# Patient Record
Sex: Female | Born: 1955 | Race: White | Hispanic: No | Marital: Married | Smoking: Never smoker
Health system: Southern US, Community
[De-identification: ages and names within clinical notes are randomized; demographics above are authoritative.]

## PROBLEM LIST (undated history)

## (undated) DIAGNOSIS — K759 Inflammatory liver disease, unspecified: Secondary | ICD-10-CM

## (undated) DIAGNOSIS — C801 Malignant (primary) neoplasm, unspecified: Secondary | ICD-10-CM

## (undated) HISTORY — PX: OTHER SURGICAL HISTORY: SHX169

---

## 2019-12-31 ENCOUNTER — Ambulatory Visit: Payer: Self-pay | Attending: Internal Medicine

## 2019-12-31 DIAGNOSIS — Z20822 Contact with and (suspected) exposure to covid-19: Secondary | ICD-10-CM

## 2019-12-31 DIAGNOSIS — U071 COVID-19: Secondary | ICD-10-CM | POA: Insufficient documentation

## 2020-01-01 LAB — NOVEL CORONAVIRUS, NAA: SARS-CoV-2, NAA: DETECTED — AB

## 2021-07-25 ENCOUNTER — Other Ambulatory Visit: Payer: Self-pay | Admitting: Urology

## 2021-07-25 DIAGNOSIS — D49511 Neoplasm of unspecified behavior of right kidney: Secondary | ICD-10-CM

## 2021-08-30 ENCOUNTER — Ambulatory Visit
Admission: RE | Admit: 2021-08-30 | Discharge: 2021-08-30 | Disposition: A | Discharge status: Home / Self Care | Payer: Self-pay | Source: Ambulatory Visit | Attending: Urology | Admitting: Urology

## 2021-08-30 DIAGNOSIS — D49511 Neoplasm of unspecified behavior of right kidney: Secondary | ICD-10-CM

## 2021-08-30 IMAGING — MR MR ABDOMEN WO/W CM
17 of 18 series · 45 of 47 positions shown · IV contrast (multihance)
Comparison: [DATE]

CLINICAL DATA: Follow-up right renal mass.

EXAM:
MRI ABDOMEN WITHOUT AND WITH CONTRAST
TECHNIQUE: Multiplanar multisequence MR imaging of the abdomen was performed
both before and after the administration of intravenous contrast.
CONTRAST:  14mL MULTIHANCE GADOBENATE DIMEGLUMINE 529 MG/ML IV SOLN

[Series 3: T2 · coronal · 5.0mm · 0.78mm/px · 2 of 22 slices shown (1 of 3)]
[im 1/22]
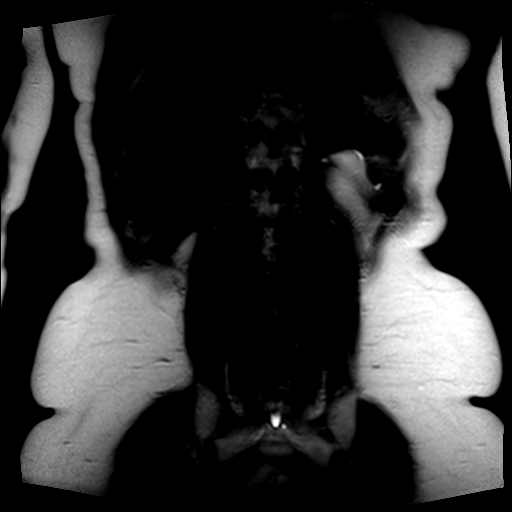
[im 22/22]
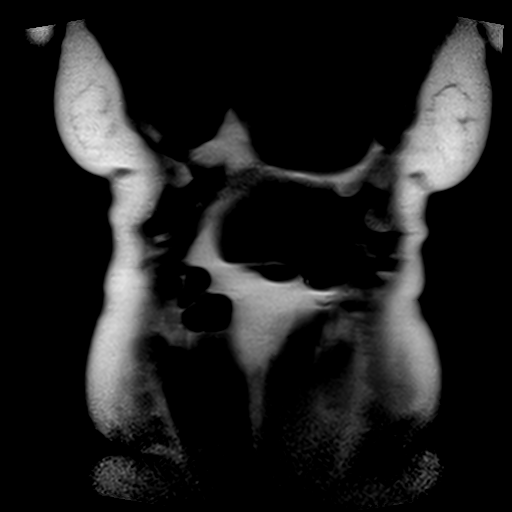

[Series 4: T2 · axial · 5.0mm · 0.66mm/px · z∈[-127,+23]mm · 2 of 26 slices shown (2 of 3)]
[im 1/26]
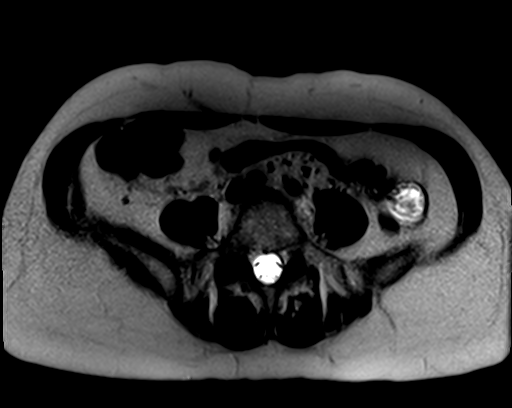
[im 26/26]
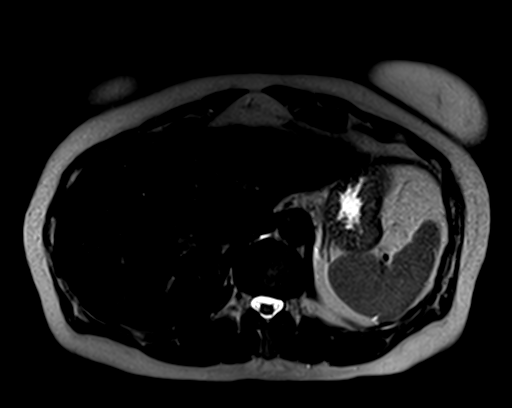

[Series 5: bSSFP · axial · 4.0mm · 0.74mm/px · z∈[-103,+45]mm · 2 of 38 slices shown]
[im 1/38]
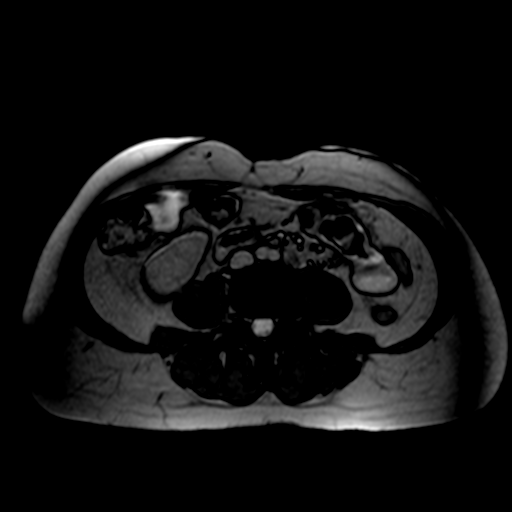
[im 38/38]
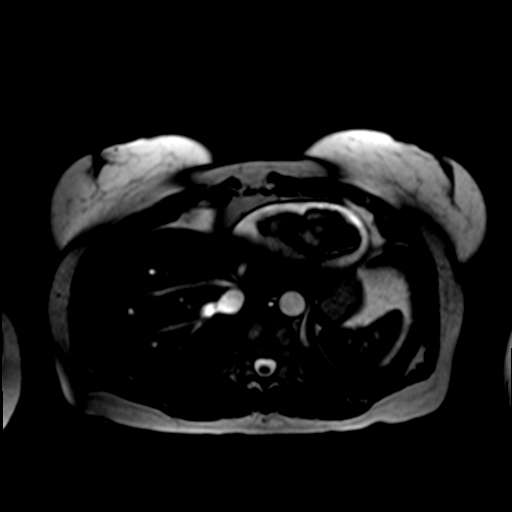

[Series 6: T1 · axial · 5.0mm · 0.66mm/px · z∈[-104,+34]mm · 3 of 48 slices shown]
[im 1/48]
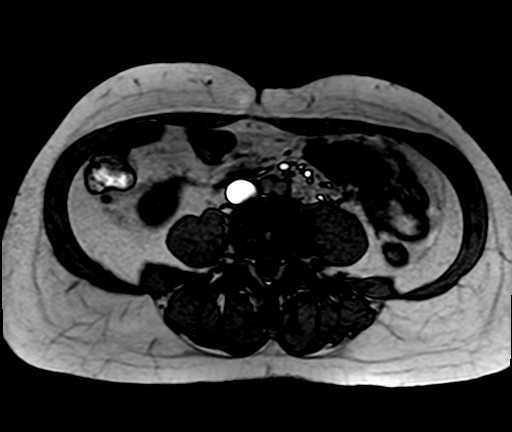
[im 24/48]
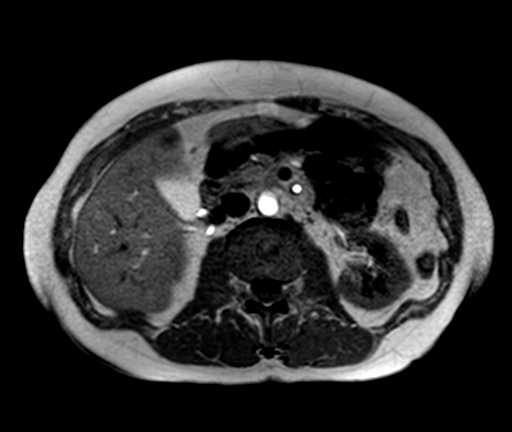
[im 48/48]
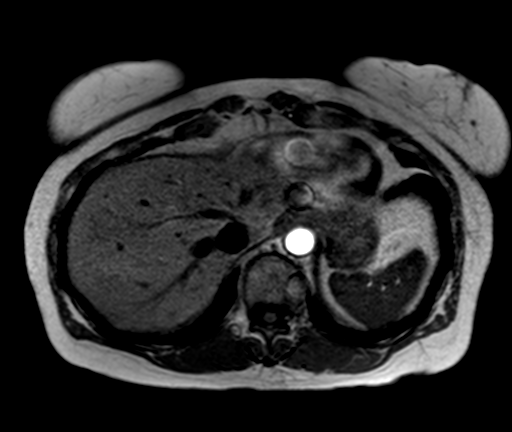

[Series 7: ep2d_diff_b50_500_800_p2_trig · axial · 6.0mm · 1.98mm/px · z∈[-128,+28]mm · 4 of 63 slices shown]
[im 1/63]
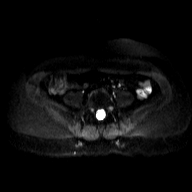
[im 21/63]
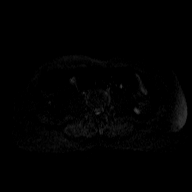
[im 42/63]
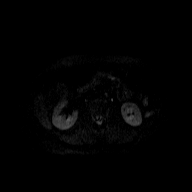
[im 63/63]
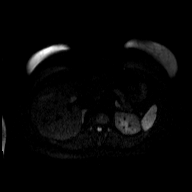

[Series 8: ep2d_[id number]_500_800_p2_trig_adc · axial · 6.0mm · 1.98mm/px · 1 of 21 slices shown]
[im 1/21]
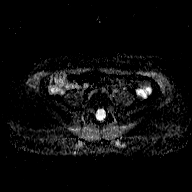

[Series 9: T2 · axial · 5.0mm · 1.33mm/px · z∈[-126,+30]mm · 2 of 27 slices shown (3 of 3)]
[im 1/27]
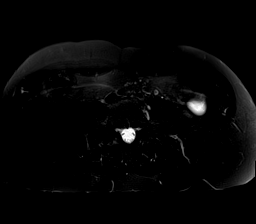
[im 27/27]
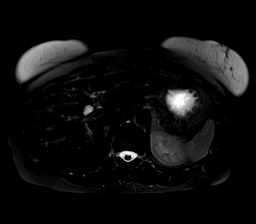

[Series 10: T1 dynamic · axial · non-contrast · 2.3mm · 1.41mm/px · z∈[-96,+40]mm · 3 of 60 slices shown]
[im 1/60]
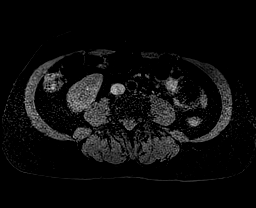
[im 30/60]
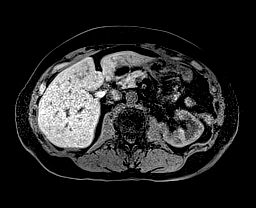
[im 60/60]
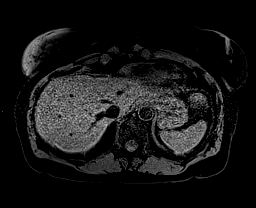

[Series 11: post 25 sec · axial · 2.3mm · 1.41mm/px · z∈[-96,+40]mm · 3 of 60 slices shown]
[im 1/60]
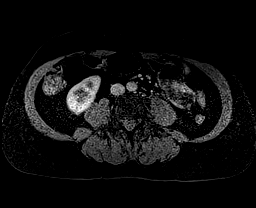
[im 30/60]
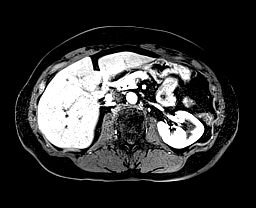
[im 60/60]
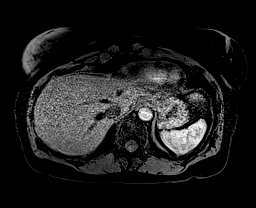

[Series 12: post 25 sec_sub · axial · 2.3mm · 1.41mm/px · z∈[-96,+40]mm · 3 of 60 slices shown]
[im 1/60]
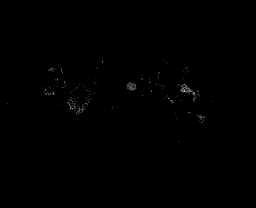
[im 30/60]
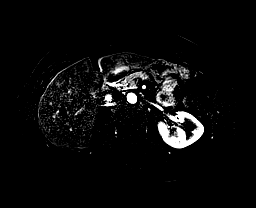
[im 60/60]
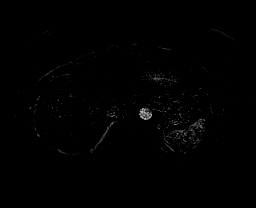

[Series 13: post 45 sec · axial · 2.3mm · 1.41mm/px · z∈[-96,+40]mm · 3 of 60 slices shown]
[im 1/60]
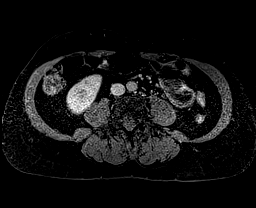
[im 30/60]
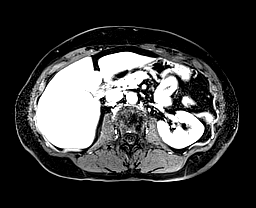
[im 60/60]
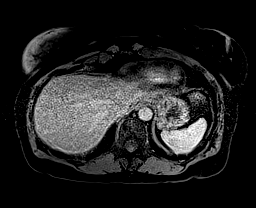

[Series 14: post 45 sec_sub · axial · 2.3mm · 1.41mm/px · z∈[-96,+40]mm · 3 of 60 slices shown]
[im 1/60]
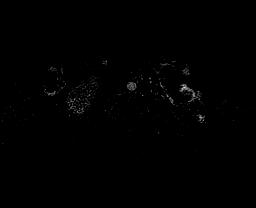
[im 30/60]
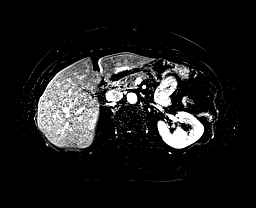
[im 60/60]
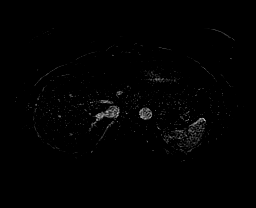

[Series 15: post 90 sec · axial · 2.3mm · 1.41mm/px · z∈[-96,+40]mm · 3 of 60 slices shown]
[im 1/60]
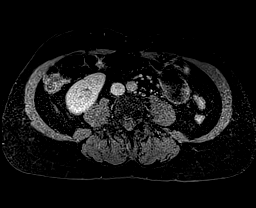
[im 30/60]
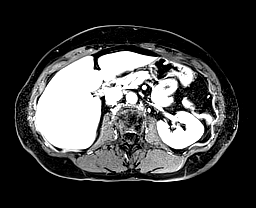
[im 60/60]
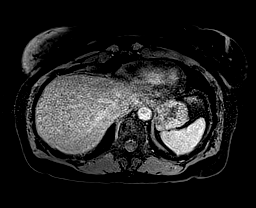

[Series 16: post 90 sec_sub · axial · 2.3mm · 1.41mm/px · z∈[-96,+40]mm · 3 of 60 slices shown]
[im 1/60]
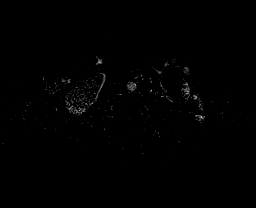
[im 30/60]
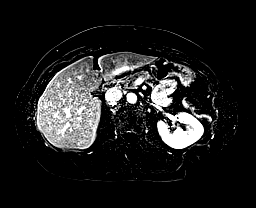
[im 60/60]
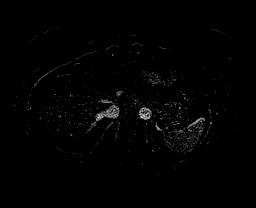

[Series 17: T1 dynamic post-contrast · coronal · 2.6mm · 0.78mm/px · 3 of 52 slices shown]
[im 1/52]
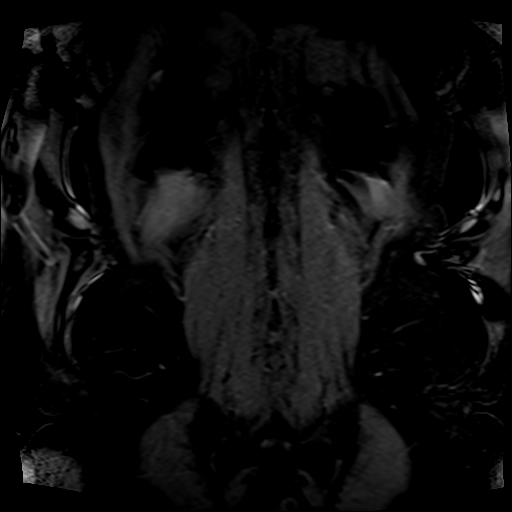
[im 26/52]
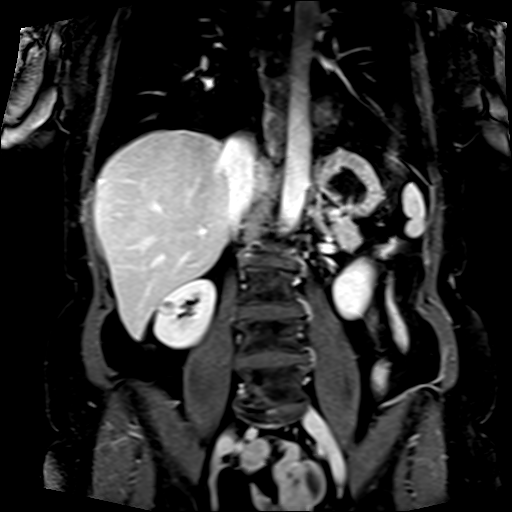
[im 52/52]
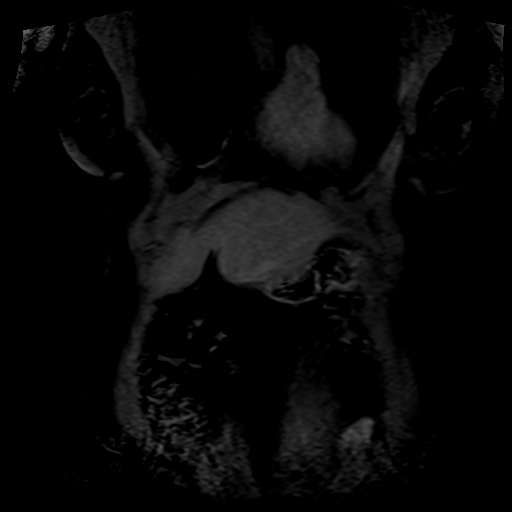

[Series 18: post axial 3+ · axial · 2.3mm · 1.41mm/px · z∈[-96,+40]mm · 3 of 60 slices shown (1 of 2)]
[im 1/60]
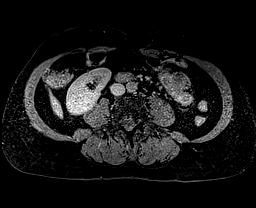
[im 30/60]
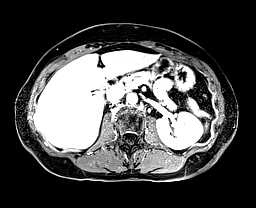
[im 60/60]
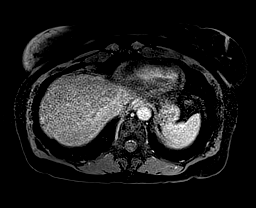

[Series 19: post axial 3+ · axial · 2.3mm · 1.41mm/px · z∈[-96,-29]mm · 2 of 60 slices shown (2 of 2)]
[im 1/60]
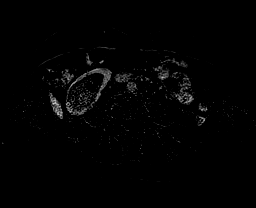
[im 30/60]
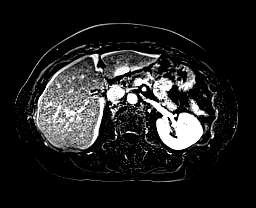

[45 of 47 positions shown; findings below may reference images not displayed]

FINDINGS: Lower chest: No acute findings.

Hepatobiliary: No hepatic masses identified. Stable 13 mm cyst seen
in the central liver adjacent to the porta hepatis and common duct,
which could represent a benign hepatic cyst or possibly a small
choledochal cyst. No evidence of biliary ductal dilatation.
Gallbladder is unremarkable.

Pancreas:  No mass or inflammatory changes.

Spleen:  Within normal limits in size and appearance.

Adrenals/Urinary Tract: Normal appearance of adrenal glands and left
kidney. A small enhancing mass is again seen in the lateral
interpolar region the right kidney which measures 13 x 12 mm on
image 52/18, compared to 11 x 11 mm prior study. This is consistent
with a small renal cell carcinoma. No other renal masses identified.
No evidence of hydronephrosis

Stomach/Bowel: Visualized portion unremarkable.

Vascular/Lymphatic: No pathologically enlarged lymph nodes
identified. No acute vascular findings.

Other:  None.

Musculoskeletal:  No suspicious bone lesions identified.
IMPRESSION: 13 mm enhancing mass in right kidney, slightly increased in size
compared to previous study and consistent with renal cell carcinoma.

No evidence of abdominal metastatic disease.

Stable small benign central hepatic cyst versus choledochal cyst.

## 2021-08-30 MED ORDER — GADOBENATE DIMEGLUMINE 529 MG/ML IV SOLN
14.0000 mL | Freq: Once | INTRAVENOUS | Status: AC | PRN
  Administered 2021-08-30: 14 mL via INTRAVENOUS

## 2022-03-12 ENCOUNTER — Other Ambulatory Visit: Payer: Self-pay | Admitting: Urology

## 2022-03-12 DIAGNOSIS — D49511 Neoplasm of unspecified behavior of right kidney: Secondary | ICD-10-CM

## 2022-03-19 ENCOUNTER — Other Ambulatory Visit: Payer: Self-pay | Admitting: Urology

## 2022-03-19 ENCOUNTER — Other Ambulatory Visit: Payer: Self-pay

## 2022-03-19 ENCOUNTER — Ambulatory Visit
Admission: RE | Admit: 2022-03-19 | Discharge: 2022-03-19 | Disposition: A | Discharge status: Home / Self Care | Payer: 59 | Source: Ambulatory Visit | Attending: Urology | Admitting: Urology

## 2022-03-19 ENCOUNTER — Ambulatory Visit
Admission: RE | Admit: 2022-03-19 | Discharge: 2022-03-19 | Disposition: A | Discharge status: Home / Self Care | Payer: 59 | Source: Ambulatory Visit

## 2022-03-19 DIAGNOSIS — D3011 Benign neoplasm of right renal pelvis: Secondary | ICD-10-CM

## 2022-03-19 DIAGNOSIS — D49511 Neoplasm of unspecified behavior of right kidney: Secondary | ICD-10-CM

## 2022-03-19 IMAGING — CR DG CHEST 2V
2 series · 2 of 2 positions shown · non-contrast
Comparison: None.

CLINICAL DATA: Right renal mass

EXAM:
CHEST - 2 VIEW

[w chest pa]
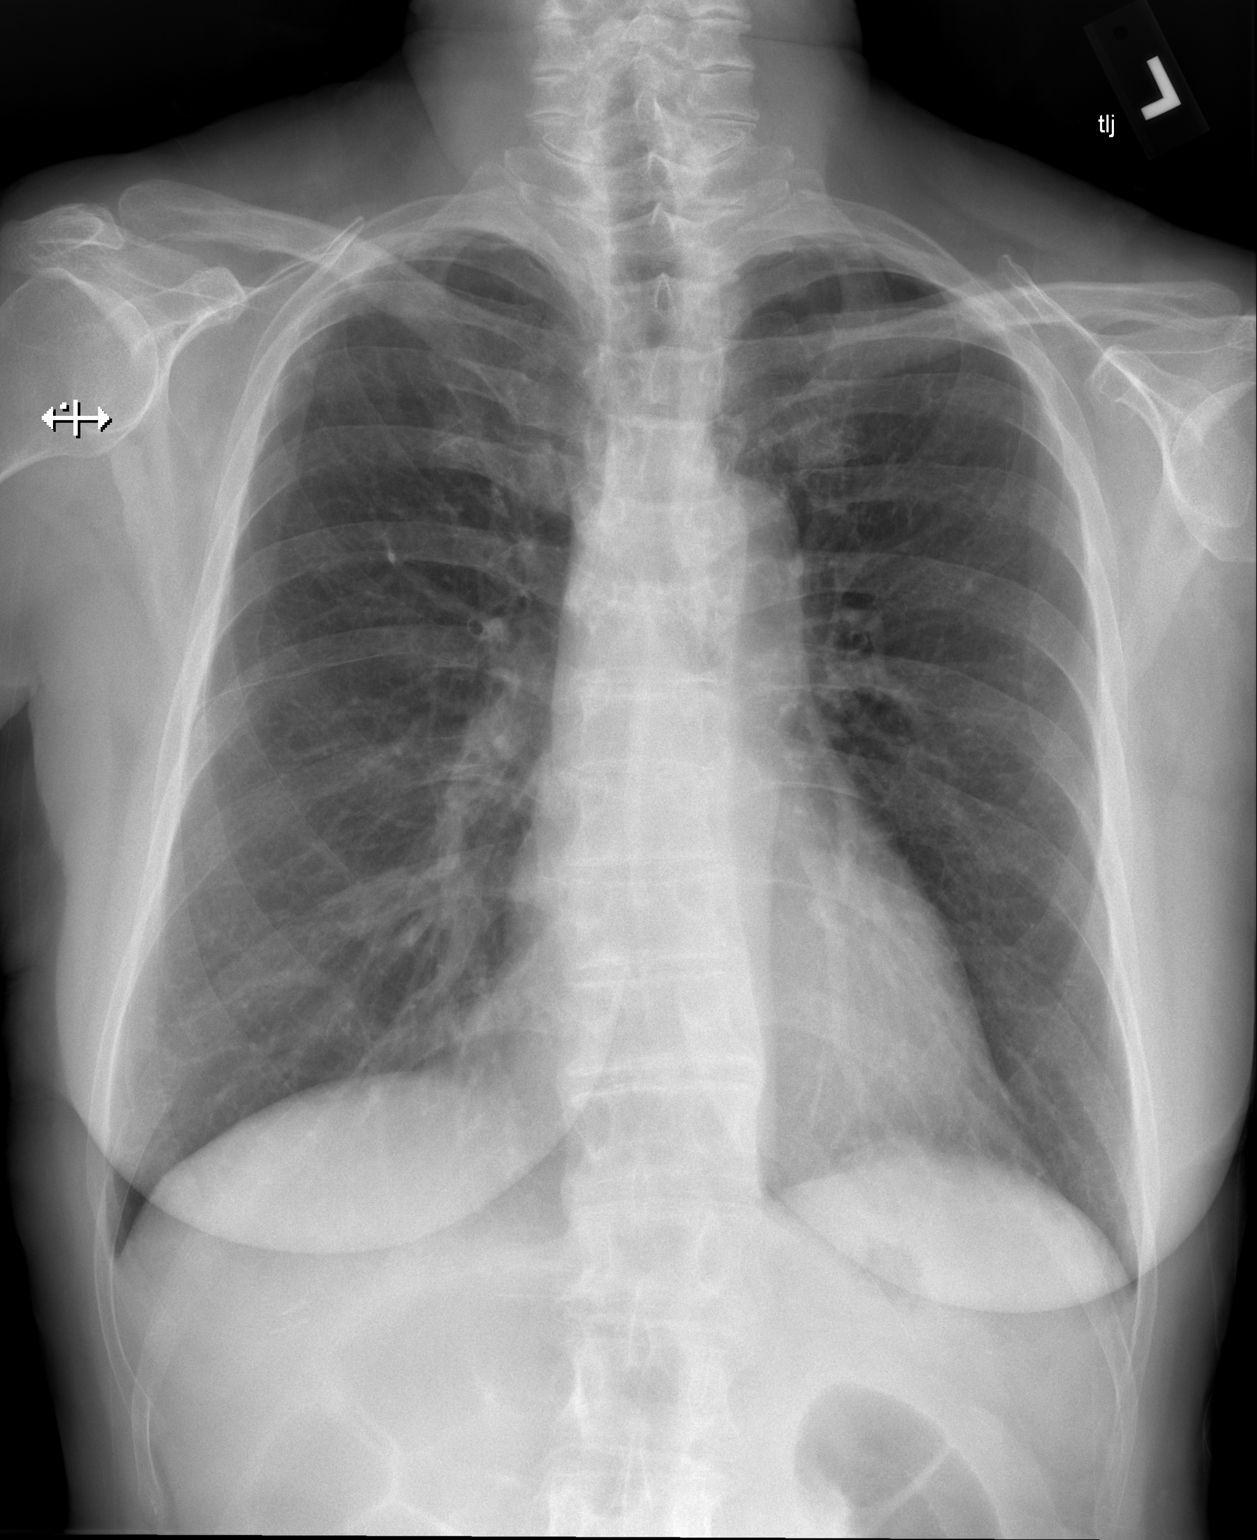

[w chest lat]
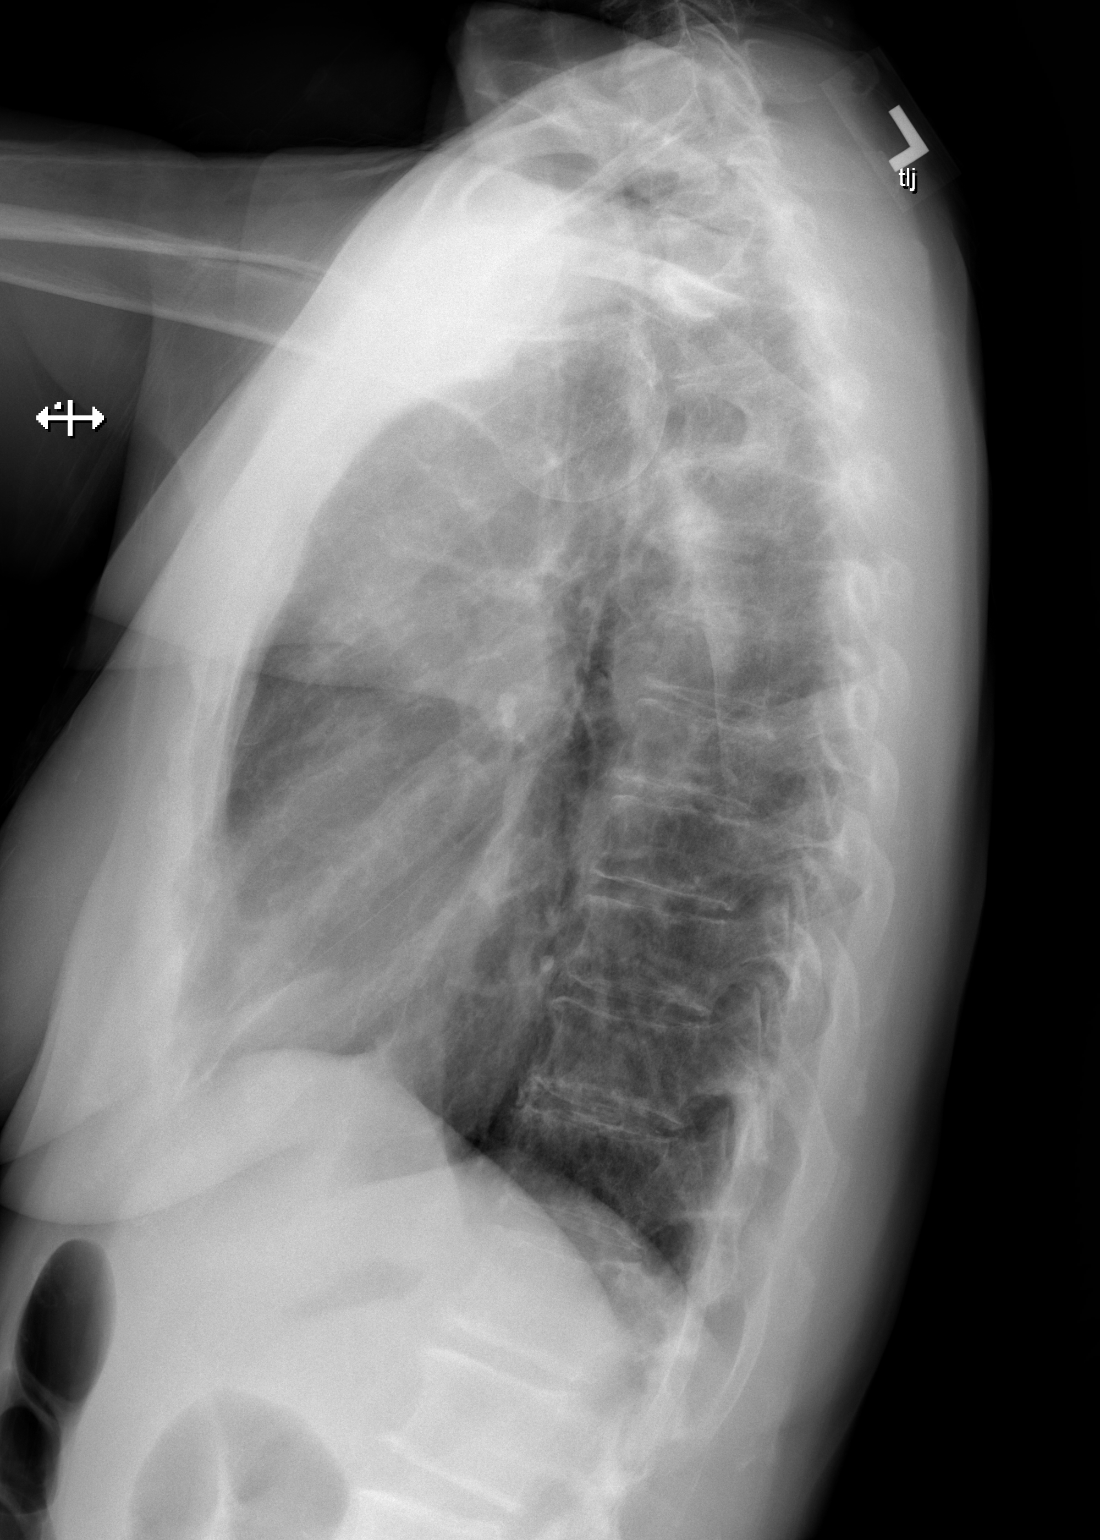

[2 of 2 positions shown; findings below may reference images not displayed]

FINDINGS: Frontal and lateral views of the chest demonstrate an unremarkable
cardiac silhouette. No airspace disease, effusion, or pneumothorax.
No acute bony abnormalities.
IMPRESSION: 1. No acute intrathoracic process.

## 2022-03-19 MED ORDER — GADOBENATE DIMEGLUMINE 529 MG/ML IV SOLN
13.0000 mL | Freq: Once | INTRAVENOUS | Status: AC | PRN
  Administered 2022-03-19: 13 mL via INTRAVENOUS

## 2022-03-27 ENCOUNTER — Ambulatory Visit
Admission: RE | Admit: 2022-03-27 | Discharge: 2022-03-27 | Disposition: A | Discharge status: Home / Self Care | Payer: 59 | Source: Ambulatory Visit | Attending: Urology | Admitting: Urology

## 2022-03-27 IMAGING — MR MR ABDOMEN WO/W CM
17 series · 48 of 48 positions shown · IV contrast (multihance)
Comparison: [DATE]

CLINICAL DATA: Follow-up of renal mass.

EXAM:
MRI ABDOMEN WITHOUT AND WITH CONTRAST
TECHNIQUE: Multiplanar multisequence MR imaging of the abdomen was performed
both before and after the administration of intravenous contrast.
CONTRAST:  14mL MULTIHANCE GADOBENATE DIMEGLUMINE 529 MG/ML IV SOLN

[Series 3: T2 · coronal · 5.0mm · 1.56mm/px · 2 of 28 slices shown (1 of 3)]
[im 1/28]
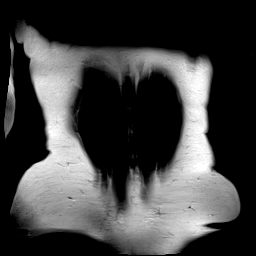
[im 28/28]
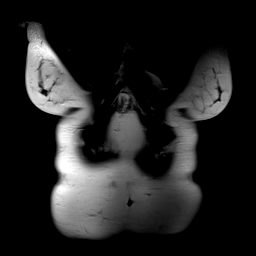

[Series 4: T1 · axial · 3.0mm · 1.19mm/px · z∈[-88,+101]mm · 5 of 128 slices shown]
[im 1/128]
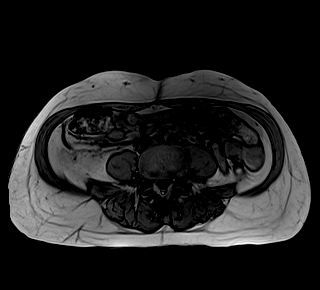
[im 32/128]
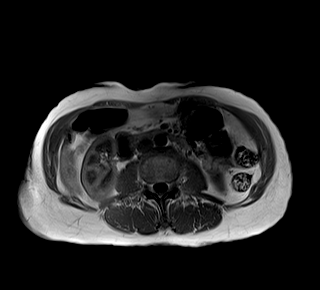
[im 64/128]
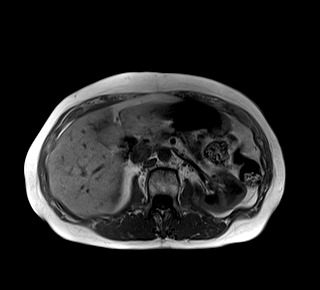
[im 96/128]
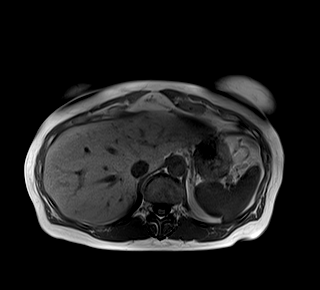
[im 128/128]
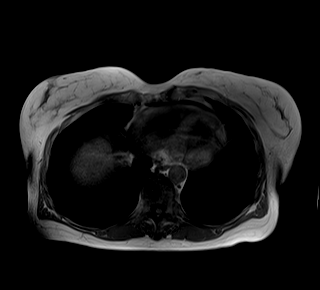

[Series 5: bSSFP · axial · 5.0mm · 1.25mm/px · 1 of 35 slices shown]
[im 1/35]
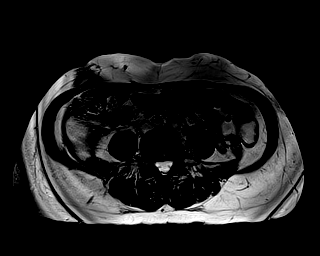

[Series 6: T2 · axial · 5.0mm · 1.48mm/px · z∈[-85,+161]mm · 2 of 42 slices shown (2 of 3)]
[im 1/42]
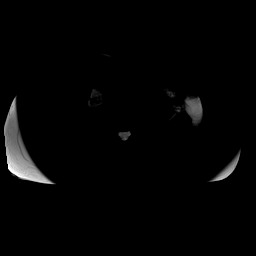
[im 42/42]
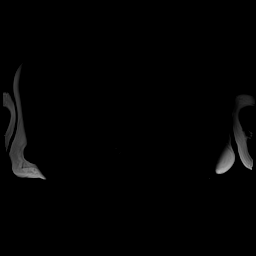

[Series 7: DWI · axial · 5.0mm · 1.42mm/px · z∈[-85,+161]mm · 5 of 126 slices shown (1 of 2)]
[im 1/126]
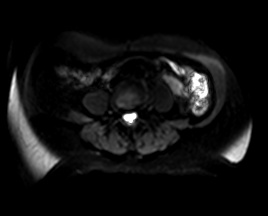
[im 32/126]
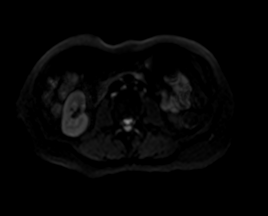
[im 63/126]
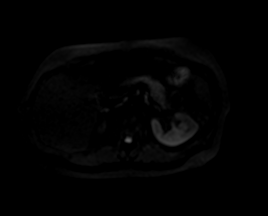
[im 94/126]
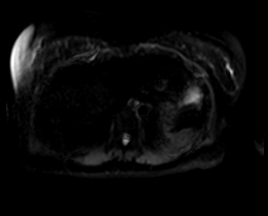
[im 126/126]
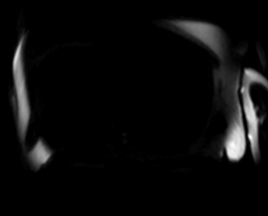

[Series 8: DWI · axial · 5.0mm · 1.42mm/px · z∈[-85,+161]mm · 2 of 42 slices shown (2 of 2)]
[im 1/42]
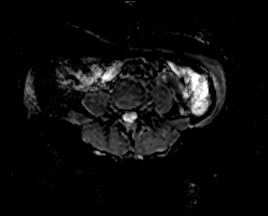
[im 42/42]
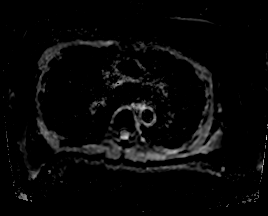

[Series 9: T2 · axial · 6.0mm · 1.19mm/px · 1 of 30 slices shown (3 of 3)]
[im 1/30]
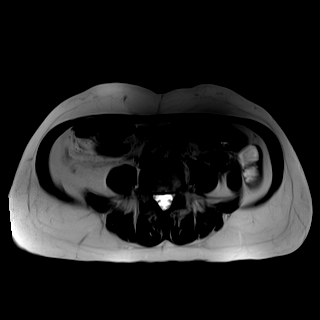

[Series 10: T1 dynamic · axial · non-contrast · 3.0mm · 1.25mm/px · z∈[-100,+113]mm · 3 of 72 slices shown]
[im 1/72]
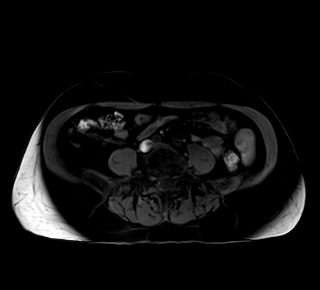
[im 36/72]
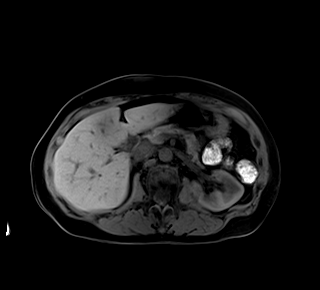
[im 72/72]
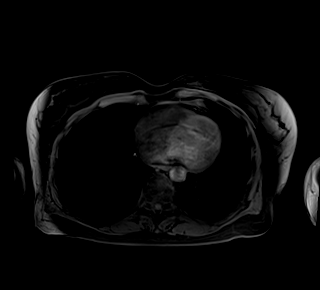

[Series 11: T1 dynamic post-contrast · axial · 3.0mm · 1.25mm/px · z∈[-100,+113]mm · 3 of 72 slices shown (1 of 9)]
[im 1/72]
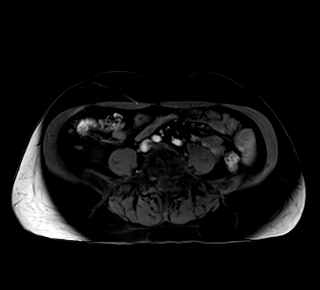
[im 36/72]
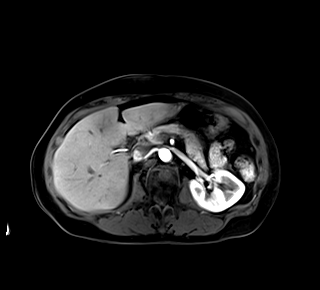
[im 72/72]
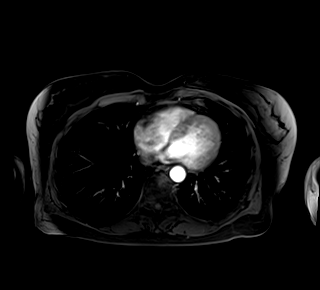

[Series 12: T1 dynamic post-contrast · axial · 3.0mm · 1.25mm/px · z∈[-100,+113]mm · 3 of 72 slices shown (2 of 9)]
[im 1/72]
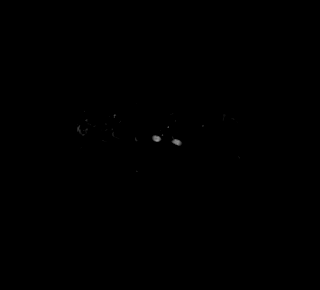
[im 36/72]
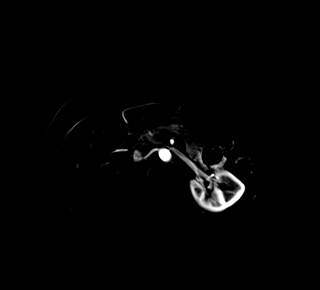
[im 72/72]
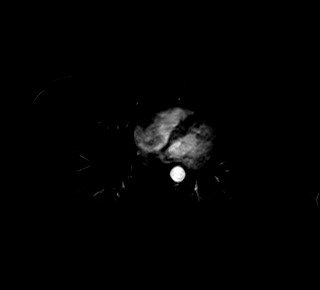

[Series 13: T1 dynamic post-contrast · axial · 3.0mm · 1.25mm/px · z∈[-100,+113]mm · 3 of 72 slices shown (3 of 9)]
[im 1/72]
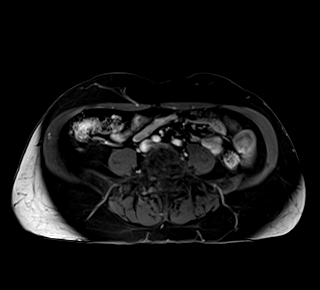
[im 36/72]
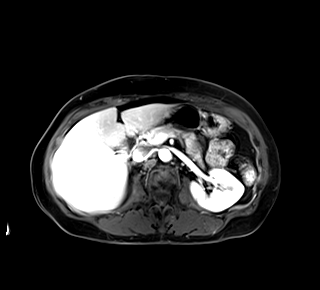
[im 72/72]
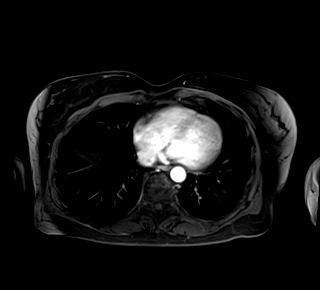

[Series 14: T1 dynamic post-contrast · axial · 3.0mm · 1.25mm/px · z∈[-100,+113]mm · 3 of 72 slices shown (4 of 9)]
[im 1/72]
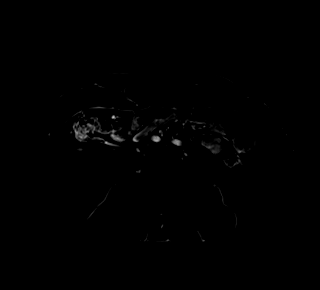
[im 36/72]
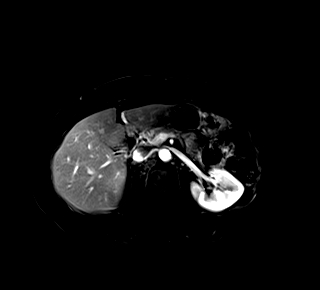
[im 72/72]
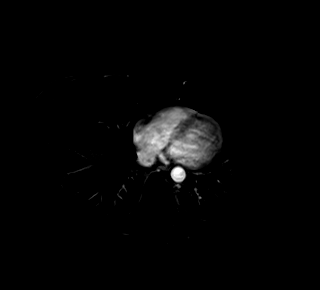

[Series 15: T1 dynamic post-contrast · axial · 3.0mm · 1.25mm/px · z∈[-100,+113]mm · 3 of 72 slices shown (5 of 9)]
[im 1/72]
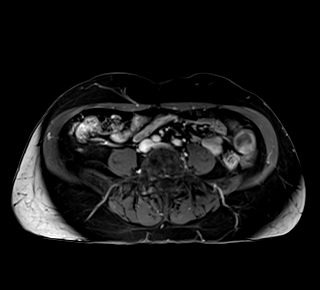
[im 36/72]
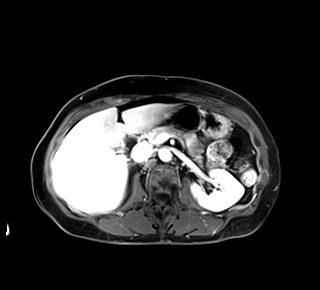
[im 72/72]
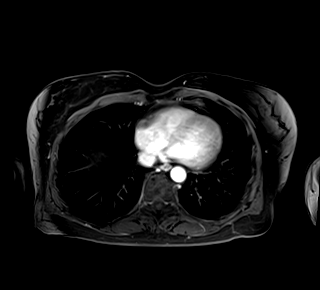

[Series 16: T1 dynamic post-contrast · axial · 3.0mm · 1.25mm/px · z∈[-100,+113]mm · 3 of 72 slices shown (6 of 9)]
[im 1/72]
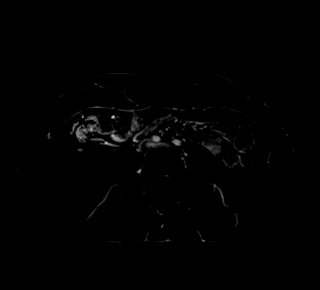
[im 36/72]
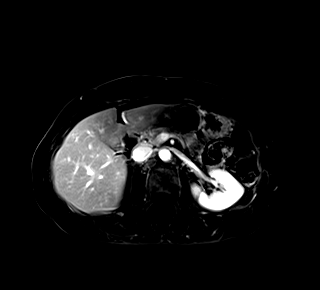
[im 72/72]
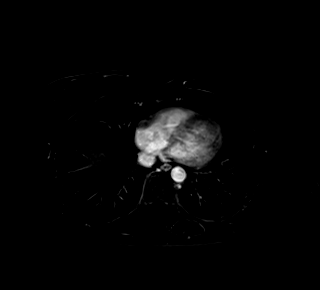

[Series 17: T1 dynamic post-contrast · coronal · 3.0mm · 1.25mm/px · 3 of 64 slices shown (7 of 9)]
[im 1/64]
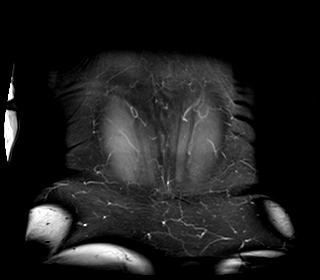
[im 32/64]
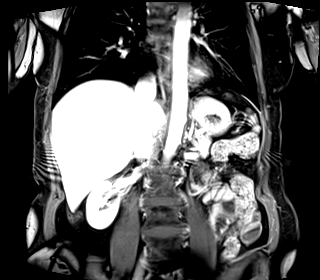
[im 64/64]
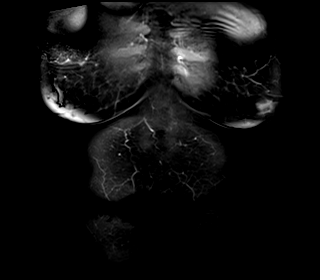

[Series 18: T1 dynamic post-contrast · axial · 3.0mm · 1.25mm/px · z∈[-100,+113]mm · 3 of 72 slices shown (8 of 9)]
[im 1/72]
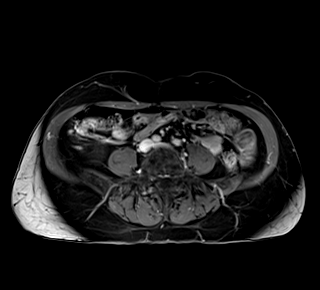
[im 36/72]
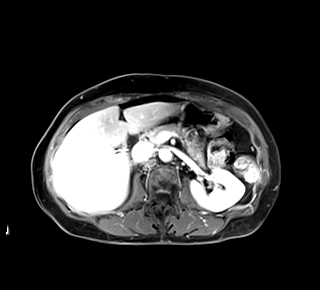
[im 72/72]
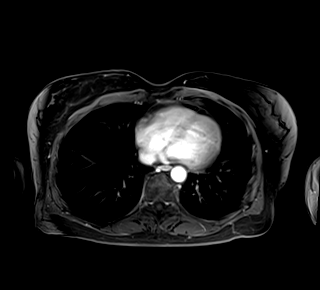

[Series 19: T1 dynamic post-contrast · axial · 3.0mm · 1.25mm/px · z∈[-100,+113]mm · 3 of 72 slices shown (9 of 9)]
[im 1/72]
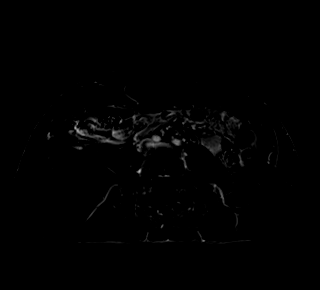
[im 36/72]
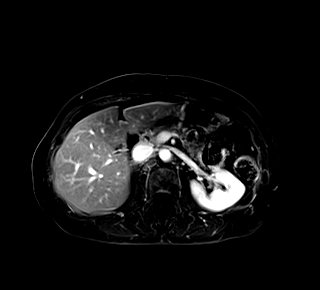
[im 72/72]
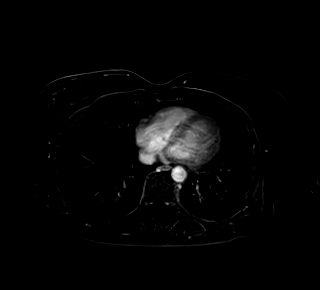

[48 of 48 positions shown; findings below may reference images not displayed]

FINDINGS: Lower chest: Normal heart size without pericardial or pleural
effusion.

Hepatobiliary: Segment 4 1.4 cm hepatic cyst. No suspicious liver
lesion. Normal gallbladder, without biliary ductal dilatation.

Pancreas:  Normal, without mass or ductal dilatation.

Spleen:  Normal in size, without focal abnormality.

Adrenals/Urinary Tract:  Normal adrenal glands.  Normal left kidney.

Mildly enhancing lesion within the lateral interpolar right kidney
measures 1.4 x 1.2 cm on 52/18 and is similar to on the prior exam.
1.1 cm craniocaudal on 24/17, similar.

No hydronephrosis.

Stomach/Bowel: Normal stomach and abdominal bowel loops.

Vascular/Lymphatic: Aortic atherosclerosis. Patent renal veins. No
retroperitoneal or retrocrural adenopathy.

Other:  No ascites.

Musculoskeletal: No acute osseous abnormality.
IMPRESSION: 1. No significant change in size of a 1.4 cm right renal enhancing
lesion, most consistent with renal cell carcinoma.
2. No right renal vein involvement or evidence of abdominal
metastatic disease.
3.  Aortic Atherosclerosis ([XJ]-[XJ]).

## 2022-03-27 MED ORDER — GADOBENATE DIMEGLUMINE 529 MG/ML IV SOLN
14.0000 mL | Freq: Once | INTRAVENOUS | Status: AC | PRN
  Administered 2022-03-27: 14 mL via INTRAVENOUS

## 2022-10-14 ENCOUNTER — Other Ambulatory Visit: Payer: Self-pay | Admitting: Urology

## 2022-10-14 DIAGNOSIS — R972 Elevated prostate specific antigen [PSA]: Secondary | ICD-10-CM

## 2023-04-25 ENCOUNTER — Other Ambulatory Visit: Payer: Self-pay | Admitting: Urology

## 2023-04-25 DIAGNOSIS — D49511 Neoplasm of unspecified behavior of right kidney: Secondary | ICD-10-CM

## 2023-06-10 ENCOUNTER — Ambulatory Visit
Admission: RE | Admit: 2023-06-10 | Discharge: 2023-06-10 | Disposition: A | Discharge status: Home / Self Care | Payer: 59 | Source: Ambulatory Visit | Attending: Urology | Admitting: Urology

## 2023-06-10 DIAGNOSIS — D49511 Neoplasm of unspecified behavior of right kidney: Secondary | ICD-10-CM

## 2023-06-10 MED ORDER — GADOPICLENOL 0.5 MMOL/ML IV SOLN
7.5000 mL | Freq: Once | INTRAVENOUS | Status: AC | PRN
  Administered 2023-06-10: 7.5 mL via INTRAVENOUS

## 2023-06-19 ENCOUNTER — Other Ambulatory Visit: Payer: Self-pay | Admitting: Urology

## 2023-07-29 NOTE — Patient Instructions (Signed)
SURGICAL WAITING ROOM VISITATION  Patients having surgery or a procedure may have no more than 2 support people in the waiting area - these visitors may rotate.    Children under the age of 30 must have an adult with them who is not the patient.  Due to an increase in RSV and influenza rates and associated hospitalizations, children ages 53 and under may not visit patients in [facility] hospitals.  If the patient needs to stay at the hospital during part of their recovery, the visitor guidelines for inpatient rooms apply. Pre-op nurse will coordinate an appropriate time for 1 support person to accompany patient in pre-op.  This support person may not rotate.    Please refer to the [facility] website for the visitor guidelines for Inpatients (after your surgery is over and you are in a regular room).       Your procedure is scheduled on:  08/08/2023    Report to [facility] Main Entrance    Report to admitting at   1030AM   Call this number if you have problems the morning of surgery [phone]            Clear liquid diet the day before surgery.   Do not eat food  or drink liquids :After Midnight.                               If you have questions, please contact your surgeon's office.       Oral Hygiene is also important to reduce your risk of infection.                                    Remember - BRUSH YOUR TEETH THE MORNING OF SURGERY WITH YOUR REGULAR TOOTHPASTE  DENTURES WILL BE REMOVED PRIOR TO SURGERY PLEASE DO NOT APPLY "Poly grip" OR ADHESIVES!!!   Do NOT smoke after Midnight   Stop all vitamins and herbal supplements 7 days before surgery.   Take these medicines the morning of surgery with A SIP OF WATER:  none   DO NOT TAKE ANY ORAL DIABETIC MEDICATIONS DAY OF YOUR SURGERY  Bring CPAP mask and tubing day of surgery.                              You may not have any metal on your body including hair pins, jewelry, and body piercing              Do not wear make-up, lotions, powders, perfumes/cologne, or deodorant  Do not wear nail polish including gel and S&S, artificial/acrylic nails, or any other type of covering on natural nails including finger and toenails. If you have artificial nails, gel coating, etc. that needs to be removed by a nail salon please have this removed prior to surgery or surgery may need to be canceled/ delayed if the surgeon/ anesthesia feels like they are unable to be safely monitored.   Do not shave  48 hours prior to surgery.               Men may shave face and neck.   Do not bring valuables to the hospital. [place] IS NOT             RESPONSIBLE   FOR VALUABLES.  Contacts, glasses, dentures or bridgework may not be worn into surgery.   Bring small overnight bag day of surgery.   DO NOT BRING YOUR HOME MEDICATIONS TO THE HOSPITAL. PHARMACY WILL DISPENSE MEDICATIONS LISTED ON YOUR MEDICATION LIST TO YOU DURING YOUR ADMISSION IN THE HOSPITAL!    Patients discharged on the day of surgery will not be allowed to drive home.  Someone NEEDS to stay with you for the first 24 hours after anesthesia.   Special Instructions: Bring a copy of your healthcare power of attorney and living will documents the day of surgery if you haven't scanned them before.              Please read over the following fact sheets you were given: IF YOU HAVE QUESTIONS ABOUT YOUR PRE-OP INSTRUCTIONS PLEASE CALL [phone]   If you received a COVID test during your pre-op visit  it is requested that you wear a mask when out in public, stay away from anyone that may not be feeling well and notify your surgeon if you develop symptoms. If you test positive for Covid or have been in contact with anyone that has tested positive in the last 10 days please notify you surgeon.    [place] - Preparing for Surgery Before surgery, you can play an important role.  Because skin is not sterile, your skin needs to be as free of germs as  possible.  You can reduce the number of germs on your skin by washing with CHG (chlorahexidine gluconate) soap before surgery.  CHG is an antiseptic cleaner which kills germs and bonds with the skin to continue killing germs even after washing. Please DO NOT use if you have an allergy to CHG or antibacterial soaps.  If your skin becomes reddened/irritated stop using the CHG and inform your nurse when you arrive at Short Stay. Do not shave (including legs and underarms) for at least 48 hours prior to the first CHG shower.  You may shave your face/neck. Please follow these instructions carefully:  1.  Shower with CHG Soap the night before surgery and the  morning of Surgery.  2.  If you choose to wash your hair, wash your hair first as usual with your  normal  shampoo.  3.  After you shampoo, rinse your hair and body thoroughly to remove the  shampoo.                           4.  Use CHG as you would any other liquid soap.  You can apply chg directly  to the skin and wash                       Gently with a scrungie or clean washcloth.  5.  Apply the CHG Soap to your body ONLY FROM THE NECK DOWN.   Do not use on face/ open                           Wound or open sores. Avoid contact with eyes, ears mouth and genitals (private parts).                       Wash face,  Genitals (private parts) with your normal soap.             6.  Wash thoroughly, paying special attention to the area where  your surgery  will be performed.  7.  Thoroughly rinse your body with warm water from the neck down.  8.  DO NOT shower/wash with your normal soap after using and rinsing off  the CHG Soap.                9.  Pat yourself dry with a clean towel.            10.  Wear clean pajamas.            11.  Place clean sheets on your bed the night of your first shower and do not  sleep with pets. Day of Surgery : Do not apply any lotions/deodorants the morning of surgery.  Please wear clean clothes to the hospital/surgery  center.  FAILURE TO FOLLOW THESE INSTRUCTIONS MAY RESULT IN THE CANCELLATION OF YOUR SURGERY PATIENT SIGNATURE_________________________________  NURSE SIGNATURE__________________________________  ________________________________________________________________________

## 2023-07-29 NOTE — Progress Notes (Addendum)
Anesthesia Review:  PCP: Delbert Harness  Cardiologist : none  Chest x-ray : EKG : Echo : Stress test: Cardiac Cath :  Activity level: can do a flight of stairs without difficutly  Sleep Study/ CPAP : none  Fasting Blood Sugar :      / Checks Blood Sugar -- times a day:   Blood Thinner/ Instructions /Last Dose: ASA / Instructions/ Last Dose :

## 2023-08-01 ENCOUNTER — Other Ambulatory Visit: Payer: Self-pay

## 2023-08-01 ENCOUNTER — Encounter (HOSPITAL_COMMUNITY)
Admission: RE | Admit: 2023-08-01 | Discharge: 2023-08-01 | Disposition: A | Discharge status: Home / Self Care | Payer: Managed Care, Other (non HMO) | Source: Ambulatory Visit | Attending: Urology | Admitting: Urology

## 2023-08-01 ENCOUNTER — Encounter (HOSPITAL_COMMUNITY): Payer: Self-pay

## 2023-08-01 VITALS — BP 139/80 | HR 81 | Temp 98.9°F | Resp 16 | Ht 65.0 in | Wt 133.0 lb

## 2023-08-01 DIAGNOSIS — Z01818 Encounter for other preprocedural examination: Secondary | ICD-10-CM | POA: Insufficient documentation

## 2023-08-01 HISTORY — DX: Malignant (primary) neoplasm, unspecified: C80.1

## 2023-08-01 HISTORY — DX: Inflammatory liver disease, unspecified: K75.9

## 2023-08-01 LAB — COMPREHENSIVE METABOLIC PANEL
ALT: 19 U/L (ref 0–44)
AST: 20 U/L (ref 15–41)
Albumin: 4.5 g/dL (ref 3.5–5.0)
Alkaline Phosphatase: 63 U/L (ref 38–126)
Anion gap: 8 (ref 5–15)
BUN: 26 mg/dL — ABNORMAL HIGH (ref 8–23)
CO2: 22 mmol/L (ref 22–32)
Calcium: 9.4 mg/dL (ref 8.9–10.3)
Chloride: 108 mmol/L (ref 98–111)
Creatinine, Ser: 0.76 mg/dL (ref 0.44–1.00)
GFR, Estimated: >60 mL/min (ref >60)
Glucose, Bld: 98 mg/dL (ref 70–99)
Potassium: 4.1 mmol/L (ref 3.5–5.1)
Sodium: 138 mmol/L (ref 135–145)
Total Bilirubin: 0.7 mg/dL (ref 0.3–1.2)
Total Protein: 8.1 g/dL (ref 6.5–8.1)

## 2023-08-01 LAB — CBC
HCT: 43.6 % (ref 36.0–46.0)
Hemoglobin: 14.2 g/dL (ref 12.0–15.0)
MCH: 29.2 pg (ref 26.0–34.0)
MCHC: 32.6 g/dL (ref 30.0–36.0)
MCV: 89.5 fL (ref 80.0–100.0)
Platelets: 217 10*3/uL (ref 150–400)
RBC: 4.87 MIL/uL (ref 3.87–5.11)
RDW: 12.9 % (ref 11.5–15.5)
WBC: 7.5 10*3/uL (ref 4.0–10.5)
nRBC: 0 % (ref 0.0–0.2)

## 2023-08-01 LAB — TYPE AND SCREEN
ABO/RH(D): O POS
Antibody Screen: NEGATIVE

## 2023-08-06 NOTE — H&P (Signed)
Office Visit Report     07/29/2023   --------------------------------------------------------------------------------   Erin Massey  MRN: [id number]  DOB: May 31, 1956, 67 year old Female  SSN:    PRIMARY CARE:  Delbert Harness, MD  PRIMARY CARE FAX:  [phone]  REFERRING:  Delbert Harness, MD  PROVIDER:  Rhoderick Moody, M.D.  TREATING:  Buzzy Han, PA-C  LOCATION:  [facility] [phone]     --------------------------------------------------------------------------------   CC/HPI: Right renal mass   Erin Massey is a 67 year old female who was found to have a 1.2 cm solid and enhancing right renal mass that was initially found on CT 11/01/20 and further characterized on subsequent MRI on 11/29/20. Her initial CT scan was ordered after abnormality was noted in her cecum during colonoscopy--follow-up imaging was unremarkable from an intestinal standpoint. The patient admits that she smokes sporadically in her 60s, but states that both of her parents smoked a regular basis. She has no personal/family history GU malignancies. No history of renal dysfunction.   09/07/2021: The patient is here today for a routine follow-up. Her surveillance MRI showed slight interval growth of her right renal mass, which now measures 13 mm in greatest dimension (previously 12 mm). She denies flank pain, weight loss or interval episodes of hematuria.   02/28/22: The patient is here today for a routine follow-up. She has not had her MRI performed yet. She denies any new health issues and plans on retiring shortly after her 66th birthday at the end of May.   04/22/23: The patient presents back today after being lost to follow-up for surveillance of her right renal mass. She is doing well and reports no new major health issues. She notes that she is urinating with slightly more urgency, but isnt' bothered enough by it to warrant an OAB medication. Denies interval UTIs, dysuria or hematuria.   07/29/2023:   Patient presents for preoperative clearance on upcoming right partial nephrectomy on 8/16 for RCC. She is on no medications. Since last seen, patient has had no changes to overall medical health nor baseline voiding function. She has not undergone any new procedures, initiated new medications, nor been given any new diagnoses. Patient does have questions with regards to possible alternate nonsurgical pathways for RCC management. Today, patient denies irritative symptoms, dysuria, flank pain, fever/chills, nausea/vomiting.     ALLERGIES: None   MEDICATIONS: None   GU PSH: None   NON-GU PSH: Cataract surgery, Bilateral, right (5/19); left (6/19) Visit Complexity (formerly GPC1X) - 04/22/2023     GU PMH: Right renal neoplasm - 04/22/2023, - 02/28/2022, - 09/07/2021, 1.2 cm endophytic right midpole renal mass, - 2021      PMH Notes: Sickle Cell, Hepatitis   NON-GU PMH: None   Immunizations: None   FAMILY HISTORY: Amyotrophic Lateral Sclerosis - Mother Lung Cancer - Father Tuberculosis - Father   SOCIAL HISTORY: Marital Status: Married Preferred Language: English; Ethnicity: Not Hispanic Or Latino; Race: White Current Smoking Status: Patient does not smoke anymore. Has not smoked since 11/22/1978. Smoked for 2 years. Smoked less than 1/2 pack per day.   Tobacco Use Assessment Completed: Used Tobacco in last 30 days? Does drink.  Drinks 1 caffeinated drink per day. Patient's occupation Aeronautical engineer.    VITAL SIGNS:      07/29/2023 08:09 AM  Weight 137 lb / 62.14 kg  Height 65 in / 165.1 cm  BP 148/77 mmHg  Heart Rate 105 /min  BMI 22.8 kg/m   MULTI-SYSTEM PHYSICAL  EXAMINATION:    Constitutional: Well-nourished. No physical deformities. Normally developed. Good grooming.  Respiratory: No labored breathing, no use of accessory muscles. Lungs clear to auscultation bilaterally. No wheezing nor Rales.  Cardiovascular: Normal temperature, normal extremity  pulses, no swelling, no varicosities. Mild tachycardia and hypertension noted today due to mild anxiety and whitecoat syndrome, per patient's admission. Otherwise, regular rhythm. No murmurs, nor gallops.  Skin: No paleness, no jaundice, no cyanosis. No lesion, no ulcer, no rash.  Neurologic / Psychiatric: Oriented to time, oriented to place, oriented to person. No depression, no anxiety, no agitation.  Gastrointestinal: No mass, no suprapubic nor bilateral CVA tenderness, no rigidity, non obese abdomen.      Complexity of Data:  Source Of History:  Patient, Medical Record Summary  Records Review:   Previous Doctor Records, Previous Patient Records  Urine Test Review:   Urinalysis  X-Ray Review: MRI Abdomen: Reviewed Report. Discussed With Patient.     07/29/23  Urinalysis  Urine Appearance Clear   Urine Color Yellow   Urine Glucose Neg mg/dL  Urine Bilirubin Neg mg/dL  Urine Ketones Neg mg/dL  Urine Specific Gravity 1.025   Urine Blood Neg ery/uL  Urine pH <=5.0   Urine Protein Trace mg/dL  Urine Urobilinogen 0.2 mg/dL  Urine Nitrites Neg   Urine Leukocyte Esterase Neg leu/uL   PROCEDURES:          Urinalysis Dipstick Dipstick Cont'd  Color: Yellow Bilirubin: Neg mg/dL  Appearance: Clear Ketones: Neg mg/dL  Specific Gravity: 0.981 Blood: Neg ery/uL  pH: <=5.0 Protein: Trace mg/dL  Glucose: Neg mg/dL Urobilinogen: 0.2 mg/dL    Nitrites: Neg    Leukocyte Esterase: Neg leu/uL    ASSESSMENT:      ICD-10 Details  1 GU:   Right renal neoplasm - D49.511 Chronic, Threat to Bodily Function   PLAN:           Orders Labs Urine Culture          Schedule Return Visit/Planned Activity: Keep Scheduled Appointment             Note: RA right partial nephrectomy on 8/16.          Document Letter(s):  Created for Patient: Clinical Summary         Notes:   Today, UA WNL. Physical exam without concerning findings. Patient's pulse and blood pressure are elevated today. She is  admittedly anxious, which is understandable.   We reviewed upcoming procedure in detail, reviewing risk and benefits. We answered patient's questions to the best of our abilities. Patient did ask about non-surgical options for management of RCC. We spoke with Dr. Liliane Shi, who was amenable to speak with patient in person as well. Ultimately, patient is amenable to proceeding with robotic assisted partial right nephrectomy for management of RCC.   Maintain upcoming surgical appointment on 8/16. Patient knows to inform this office of any interim changes to baseline voiding function, or overall health. Patient voiced understanding and is amenable to this plan.   We discussed that the mass in question has features concerning for malignancy. I explained the natural history of presumed renal cell carcinoma. I reviewed the AUA guidelines for evaluation and treatment of the small renal mass. The options of active surveillance, in situ tumor ablation, partial and radical nephrectomy was discussed. The risks of robot-assisted RIGHT partial nephrectomy were discussed in detail including but not limited to: negative pathology, open conversion, completion nephrectomy, infection of the urinary tract/skin/abdominal cavity, VTE, MI/CVA,  lymphatic leak, injury to adjacent solid/hollow viscus organs, bleeding requiring a blood transfusion, catastrophic bleeding, hernia formation, need for postoperative angioembolization, urinary leak requiring stent/drain, and other imponderables.

## 2023-08-08 ENCOUNTER — Inpatient Hospital Stay (HOSPITAL_COMMUNITY): Payer: Managed Care, Other (non HMO) | Admitting: Anesthesiology

## 2023-08-08 ENCOUNTER — Encounter (HOSPITAL_COMMUNITY): Payer: Self-pay | Admitting: Urology

## 2023-08-08 ENCOUNTER — Other Ambulatory Visit: Payer: Self-pay

## 2023-08-08 ENCOUNTER — Encounter (HOSPITAL_COMMUNITY)
Admission: RE | Disposition: A | Discharge status: Home / Self Care | Payer: Self-pay | Source: Home / Self Care | Attending: Urology

## 2023-08-08 ENCOUNTER — Inpatient Hospital Stay (HOSPITAL_COMMUNITY)
Admission: RE | Admit: 2023-08-08 | Discharge: 2023-08-09 | DRG: 658 | Disposition: A | Discharge status: Home / Self Care | Payer: Managed Care, Other (non HMO) | Attending: Urology | Admitting: Urology

## 2023-08-08 ENCOUNTER — Inpatient Hospital Stay (HOSPITAL_COMMUNITY): Payer: Managed Care, Other (non HMO) | Admitting: Physician Assistant

## 2023-08-08 DIAGNOSIS — Z9841 Cataract extraction status, right eye: Secondary | ICD-10-CM | POA: Diagnosis not present

## 2023-08-08 DIAGNOSIS — Z9842 Cataract extraction status, left eye: Secondary | ICD-10-CM

## 2023-08-08 DIAGNOSIS — Z85828 Personal history of other malignant neoplasm of skin: Secondary | ICD-10-CM

## 2023-08-08 DIAGNOSIS — C641 Malignant neoplasm of right kidney, except renal pelvis: Secondary | ICD-10-CM | POA: Diagnosis present

## 2023-08-08 DIAGNOSIS — Z801 Family history of malignant neoplasm of trachea, bronchus and lung: Secondary | ICD-10-CM | POA: Diagnosis not present

## 2023-08-08 DIAGNOSIS — N2889 Other specified disorders of kidney and ureter: Secondary | ICD-10-CM | POA: Diagnosis present

## 2023-08-08 DIAGNOSIS — N3592 Unspecified urethral stricture, female: Secondary | ICD-10-CM | POA: Diagnosis present

## 2023-08-08 DIAGNOSIS — N35919 Unspecified urethral stricture, male, unspecified site: Secondary | ICD-10-CM | POA: Diagnosis present

## 2023-08-08 DIAGNOSIS — Z87891 Personal history of nicotine dependence: Secondary | ICD-10-CM | POA: Diagnosis not present

## 2023-08-08 HISTORY — PX: ROBOTIC ASSITED PARTIAL NEPHRECTOMY: SHX6087

## 2023-08-08 LAB — ABO/RH: ABO/RH(D): O POS

## 2023-08-08 LAB — HEMOGLOBIN AND HEMATOCRIT, BLOOD
HCT: 41.8 % (ref 36.0–46.0)
Hemoglobin: 13 g/dL (ref 12.0–15.0)

## 2023-08-08 SURGERY — NEPHRECTOMY, PARTIAL, ROBOT-ASSISTED
Anesthesia: General | Laterality: Right

## 2023-08-08 MED ORDER — ACETAMINOPHEN 500 MG PO TABS
1000.0000 mg | ORAL_TABLET | Freq: Once | ORAL | Status: AC
  Administered 2023-08-08: 1000 mg via ORAL
  Filled 2023-08-08: qty 2

## 2023-08-08 MED ORDER — ONDANSETRON HCL 4 MG/2ML IJ SOLN
INTRAMUSCULAR | Status: DC | PRN
Start: 2023-08-08 — End: 2023-08-08
  Administered 2023-08-08: 4 mg via INTRAVENOUS

## 2023-08-08 MED ORDER — HYOSCYAMINE SULFATE 0.125 MG SL SUBL: 0.1250 mg | SUBLINGUAL_TABLET | SUBLINGUAL | Status: DC | PRN

## 2023-08-08 MED ORDER — BUPIVACAINE LIPOSOME 1.3 % IJ SUSP
INTRAMUSCULAR | Status: AC
  Filled 2023-08-08: qty 20

## 2023-08-08 MED ORDER — LACTATED RINGERS IV SOLN
INTRAVENOUS | Status: DC | PRN
Start: 2023-08-08 — End: 2023-08-08

## 2023-08-08 MED ORDER — ORAL CARE MOUTH RINSE: 15.0000 mL | Freq: Once | OROMUCOSAL | Status: AC

## 2023-08-08 MED ORDER — LIDOCAINE HCL (PF) 2 % IJ SOLN
INTRAMUSCULAR | Status: AC
  Filled 2023-08-08: qty 10

## 2023-08-08 MED ORDER — ROCURONIUM BROMIDE 100 MG/10ML IV SOLN
INTRAVENOUS | Status: DC | PRN
  Administered 2023-08-08: 30 mg via INTRAVENOUS
  Administered 2023-08-08: 50 mg via INTRAVENOUS

## 2023-08-08 MED ORDER — ACETAMINOPHEN 500 MG PO TABS
1000.0000 mg | ORAL_TABLET | Freq: Four times a day (QID) | ORAL | Status: AC
  Administered 2023-08-08 – 2023-08-09 (×4): 1000 mg via ORAL
  Filled 2023-08-08 (×4): qty 2

## 2023-08-08 MED ORDER — PROPOFOL 10 MG/ML IV BOLUS
INTRAVENOUS | Status: AC
  Filled 2023-08-08: qty 20

## 2023-08-08 MED ORDER — DIPHENHYDRAMINE HCL 12.5 MG/5ML PO ELIX: 12.5000 mg | ORAL_SOLUTION | Freq: Four times a day (QID) | ORAL | Status: DC | PRN

## 2023-08-08 MED ORDER — ONDANSETRON HCL 4 MG/2ML IJ SOLN
4.0000 mg | INTRAMUSCULAR | Status: DC | PRN
  Administered 2023-08-08: 4 mg via INTRAVENOUS
  Filled 2023-08-08: qty 2

## 2023-08-08 MED ORDER — "VISTASEAL 4 ML SINGLE DOSE KIT "
4.0000 mL | PACK | Freq: Once | CUTANEOUS | Status: AC
  Administered 2023-08-08: 4 mL via TOPICAL
  Filled 2023-08-08: qty 4

## 2023-08-08 MED ORDER — KETAMINE HCL 50 MG/5ML IJ SOSY
PREFILLED_SYRINGE | INTRAMUSCULAR | Status: AC
  Filled 2023-08-08: qty 5

## 2023-08-08 MED ORDER — FENTANYL CITRATE (PF) 100 MCG/2ML IJ SOLN
INTRAMUSCULAR | Status: DC | PRN
  Administered 2023-08-08 (×2): 50 ug via INTRAVENOUS

## 2023-08-08 MED ORDER — DOCUSATE SODIUM 100 MG PO CAPS: 100.0000 mg | ORAL_CAPSULE | Freq: Two times a day (BID) | ORAL | Status: AC

## 2023-08-08 MED ORDER — LIDOCAINE HCL (CARDIAC) PF 100 MG/5ML IV SOSY
PREFILLED_SYRINGE | INTRAVENOUS | Status: DC | PRN
  Administered 2023-08-08: 60 mg via INTRAVENOUS

## 2023-08-08 MED ORDER — KETAMINE HCL 10 MG/ML IJ SOLN
INTRAMUSCULAR | Status: DC | PRN
  Administered 2023-08-08: 30 mg via INTRAVENOUS

## 2023-08-08 MED ORDER — HYDROMORPHONE HCL 1 MG/ML IJ SOLN
0.5000 mg | INTRAMUSCULAR | Status: DC | PRN
  Administered 2023-08-08 – 2023-08-09 (×2): 0.5 mg via INTRAVENOUS
  Filled 2023-08-08 (×2): qty 1

## 2023-08-08 MED ORDER — PROPOFOL 10 MG/ML IV BOLUS
INTRAVENOUS | Status: DC | PRN
Start: 2023-08-08 — End: 2023-08-08
  Administered 2023-08-08: 130 mg via INTRAVENOUS

## 2023-08-08 MED ORDER — SODIUM CHLORIDE (PF) 0.9 % IJ SOLN
INTRAMUSCULAR | Status: DC | PRN
  Administered 2023-08-08: 20 mL

## 2023-08-08 MED ORDER — DOCUSATE SODIUM 100 MG PO CAPS
100.0000 mg | ORAL_CAPSULE | Freq: Two times a day (BID) | ORAL | Status: DC
  Administered 2023-08-08 – 2023-08-09 (×2): 100 mg via ORAL
  Filled 2023-08-08 (×2): qty 1

## 2023-08-08 MED ORDER — LIDOCAINE HCL (PF) 2 % IJ SOLN
INTRAMUSCULAR | Status: DC | PRN
  Administered 2023-08-08: 1.5 mg/kg/h via INTRADERMAL

## 2023-08-08 MED ORDER — LACTATED RINGERS IV SOLN: INTRAVENOUS | Status: DC

## 2023-08-08 MED ORDER — STERILE WATER FOR IRRIGATION IR SOLN
Status: DC | PRN
  Administered 2023-08-08: 2000 mL

## 2023-08-08 MED ORDER — LACTATED RINGERS IR SOLN
Status: DC | PRN
  Administered 2023-08-08: 1000 mL

## 2023-08-08 MED ORDER — FENTANYL CITRATE (PF) 250 MCG/5ML IJ SOLN
INTRAMUSCULAR | Status: AC
  Filled 2023-08-08: qty 5

## 2023-08-08 MED ORDER — ONDANSETRON HCL 4 MG/2ML IJ SOLN
INTRAMUSCULAR | Status: AC
  Filled 2023-08-08: qty 2

## 2023-08-08 MED ORDER — SUGAMMADEX SODIUM 200 MG/2ML IV SOLN
INTRAVENOUS | Status: DC | PRN
Start: 2023-08-08 — End: 2023-08-08
  Administered 2023-08-08: 200 mg via INTRAVENOUS

## 2023-08-08 MED ORDER — HYDROCODONE-ACETAMINOPHEN 5-325 MG PO TABS: 1.0000 | ORAL_TABLET | Freq: Four times a day (QID) | ORAL | 0 refills | Status: AC | PRN

## 2023-08-08 MED ORDER — MIDAZOLAM HCL 5 MG/5ML IJ SOLN
INTRAMUSCULAR | Status: DC | PRN
  Administered 2023-08-08: 2 mg via INTRAVENOUS

## 2023-08-08 MED ORDER — MIDAZOLAM HCL 2 MG/2ML IJ SOLN
INTRAMUSCULAR | Status: AC
  Filled 2023-08-08: qty 2

## 2023-08-08 MED ORDER — DIPHENHYDRAMINE HCL 50 MG/ML IJ SOLN: 12.5000 mg | Freq: Four times a day (QID) | INTRAMUSCULAR | Status: DC | PRN

## 2023-08-08 MED ORDER — SODIUM CHLORIDE 0.45 % IV SOLN: INTRAVENOUS | Status: DC

## 2023-08-08 MED ORDER — BUPIVACAINE LIPOSOME 1.3 % IJ SUSP
INTRAMUSCULAR | Status: DC | PRN
  Administered 2023-08-08: 20 mL

## 2023-08-08 MED ORDER — OXYCODONE HCL 5 MG PO TABS
5.0000 mg | ORAL_TABLET | ORAL | Status: DC | PRN
  Administered 2023-08-09: 5 mg via ORAL
  Filled 2023-08-08: qty 1

## 2023-08-08 MED ORDER — CEFAZOLIN SODIUM-DEXTROSE 2-4 GM/100ML-% IV SOLN
2.0000 g | INTRAVENOUS | Status: AC
  Administered 2023-08-08: 2 g via INTRAVENOUS
  Filled 2023-08-08: qty 100

## 2023-08-08 MED ORDER — ROCURONIUM BROMIDE 10 MG/ML (PF) SYRINGE
PREFILLED_SYRINGE | INTRAVENOUS | Status: AC
  Filled 2023-08-08: qty 10

## 2023-08-08 MED ORDER — FENTANYL CITRATE PF 50 MCG/ML IJ SOSY: 25.0000 ug | PREFILLED_SYRINGE | INTRAMUSCULAR | Status: DC | PRN

## 2023-08-08 MED ORDER — DEXMEDETOMIDINE HCL IN NACL 200 MCG/50ML IV SOLN
INTRAVENOUS | Status: DC | PRN
Start: 2023-08-08 — End: 2023-08-08
  Administered 2023-08-08: 4 ug via INTRAVENOUS
  Administered 2023-08-08 (×2): 8 ug via INTRAVENOUS

## 2023-08-08 MED ORDER — DEXAMETHASONE SODIUM PHOSPHATE 10 MG/ML IJ SOLN
INTRAMUSCULAR | Status: DC | PRN
  Administered 2023-08-08: 8 mg via INTRAVENOUS

## 2023-08-08 MED ORDER — PHENYLEPHRINE HCL-NACL 20-0.9 MG/250ML-% IV SOLN
INTRAVENOUS | Status: AC
  Filled 2023-08-08: qty 250

## 2023-08-08 MED ORDER — CHLORHEXIDINE GLUCONATE 0.12 % MT SOLN
15.0000 mL | Freq: Once | OROMUCOSAL | Status: AC
  Administered 2023-08-08: 15 mL via OROMUCOSAL

## 2023-08-08 MED ORDER — DEXAMETHASONE SODIUM PHOSPHATE 10 MG/ML IJ SOLN
INTRAMUSCULAR | Status: AC
  Filled 2023-08-08: qty 1

## 2023-08-08 SURGICAL SUPPLY — 81 items
ADH SKN CLS APL DERMABOND .7 (GAUZE/BANDAGES/DRESSINGS) ×1
AGENT HMST KT MTR STRL THRMB (HEMOSTASIS)
APL ESCP 34 STRL LF DISP (HEMOSTASIS)
APL LAPSCP 35 DL APL RGD (MISCELLANEOUS) ×2
APL PRP STRL LF DISP 70% ISPRP (MISCELLANEOUS) ×1
APPLICATOR SURGIFLO ENDO (HEMOSTASIS) IMPLANT
APPLICATOR VISTASEAL 35 (MISCELLANEOUS) ×1 IMPLANT
BAG COUNTER SPONGE SURGICOUNT (BAG) IMPLANT
BAG SPNG CNTER NS LX DISP (BAG)
CAUTERY HOOK MNPLR 1.6 DVNC XI (INSTRUMENTS) ×1 IMPLANT
CHLORAPREP W/TINT 26 (MISCELLANEOUS) ×1 IMPLANT
CLIP LIGATING HEM O LOK PURPLE (MISCELLANEOUS) ×2 IMPLANT
CLIP LIGATING HEMO LOK XL GOLD (MISCELLANEOUS) IMPLANT
CLIP LIGATING HEMO O LOK GREEN (MISCELLANEOUS) IMPLANT
CLIP SUT LAPRA TY ABSORB (SUTURE) ×2 IMPLANT
COVER SURGICAL LIGHT HANDLE (MISCELLANEOUS) ×1 IMPLANT
COVER TIP SHEARS 8 DVNC (MISCELLANEOUS) ×1 IMPLANT
CUTTER ECHEON FLEX ENDO 45 340 (ENDOMECHANICALS) IMPLANT
DERMABOND ADVANCED .7 DNX12 (GAUZE/BANDAGES/DRESSINGS) ×1 IMPLANT
DRAIN CHANNEL 15F RND FF 3/16 (WOUND CARE) IMPLANT
DRAIN RELI 100 BL SUC LF ST (DRAIN)
DRAPE ARM DVNC X/XI (DISPOSABLE) ×4 IMPLANT
DRAPE COLUMN DVNC XI (DISPOSABLE) ×1 IMPLANT
DRAPE INCISE IOBAN 66X45 STRL (DRAPES) ×1 IMPLANT
DRAPE SHEET LG 3/4 BI-LAMINATE (DRAPES) ×1 IMPLANT
DRIVER NDL LRG 8 DVNC XI (INSTRUMENTS) ×3 IMPLANT
DRIVER NDLE LRG 8 DVNC XI (INSTRUMENTS) ×3 IMPLANT
ELECT PENCIL ROCKER SW 15FT (MISCELLANEOUS) ×1 IMPLANT
ELECT REM PT RETURN 15FT ADLT (MISCELLANEOUS) ×1 IMPLANT
EVACUATOR SILICONE 100CC (DRAIN) IMPLANT
FORCEPS PROGRASP DVNC XI (FORCEP) ×2 IMPLANT
GAUZE 4X4 16PLY ~~LOC~~+RFID DBL (SPONGE) IMPLANT
GLOVE BIO SURGEON STRL SZ 6.5 (GLOVE) ×1 IMPLANT
GLOVE BIOGEL PI IND STRL 8 (GLOVE) ×1 IMPLANT
GLOVE SURG LX STRL 7.5 STRW (GLOVE) ×2 IMPLANT
GOWN STRL REUS W/ TWL XL LVL3 (GOWN DISPOSABLE) ×2 IMPLANT
GOWN STRL REUS W/TWL XL LVL3 (GOWN DISPOSABLE) ×2
GOWN STRL SURGICAL XL XLNG (GOWN DISPOSABLE) ×1 IMPLANT
HEMOSTAT SURGICEL 4X8 (HEMOSTASIS) ×1 IMPLANT
HOLDER FOLEY CATH W/STRAP (MISCELLANEOUS) ×1 IMPLANT
IRRIG SUCT STRYKERFLOW 2 WTIP (MISCELLANEOUS) ×1
IRRIGATION SUCT STRKRFLW 2 WTP (MISCELLANEOUS) ×1 IMPLANT
KIT BASIN OR (CUSTOM PROCEDURE TRAY) ×1 IMPLANT
KIT TURNOVER KIT A (KITS) IMPLANT
LOOP VESSEL MAXI BLUE (MISCELLANEOUS) IMPLANT
MARKER SKIN DUAL TIP RULER LAB (MISCELLANEOUS) ×1 IMPLANT
NDL INSUFFLATION 14GA 120MM (NEEDLE) ×1 IMPLANT
NEEDLE INSUFFLATION 14GA 120MM (NEEDLE) ×1 IMPLANT
PROTECTOR NERVE ULNAR (MISCELLANEOUS) ×2 IMPLANT
RELOAD STAPLE 45 2.6 WHT THIN (STAPLE) IMPLANT
SCISSORS LAP 5X45 EPIX DISP (ENDOMECHANICALS) ×1 IMPLANT
SCISSORS MNPLR CVD DVNC XI (INSTRUMENTS) ×1 IMPLANT
SEAL UNIV 5-12 XI (MISCELLANEOUS) ×3 IMPLANT
SET TUBE SMOKE EVAC HIGH FLOW (TUBING) ×1 IMPLANT
SOL ELECTROSURG ANTI STICK (MISCELLANEOUS) ×1
SOLUTION ELECTROSURG ANTI STCK (MISCELLANEOUS) ×1 IMPLANT
SPIKE FLUID TRANSFER (MISCELLANEOUS) ×1 IMPLANT
STAPLE RELOAD 45 WHT (STAPLE) IMPLANT
SURGIFLO W/THROMBIN 8M KIT (HEMOSTASIS) IMPLANT
SUT ETHILON 2 0 PS N (SUTURE) IMPLANT
SUT MNCRL AB 4-0 PS2 18 (SUTURE) ×2 IMPLANT
SUT PDS AB 0 CT1 36 (SUTURE) IMPLANT
SUT V-LOC BARB 180 2/0GR6 GS22 (SUTURE) ×1
SUT VIC AB 1 CT1 36 (SUTURE) ×4 IMPLANT
SUT VIC AB 2-0 SH 27 (SUTURE) ×1
SUT VIC AB 2-0 SH 27X BRD (SUTURE) IMPLANT
SUT VIC AB 4-0 SH 27 (SUTURE) ×2
SUT VIC AB 4-0 SH 27XBRD (SUTURE) IMPLANT
SUT VICRYL 0 UR6 27IN ABS (SUTURE) IMPLANT
SUT VLOC 3-0 9IN GRN (SUTURE) IMPLANT
SUT VLOC BARB 180 ABS3/0GR12 (SUTURE) ×2
SUTURE V-LC BRB 180 2/0GR6GS22 (SUTURE) ×1 IMPLANT
SUTURE VLOC BRB 180 ABS3/0GR12 (SUTURE) ×1 IMPLANT
SYS BAG RETRIEVAL 10MM (BASKET) ×2
SYSTEM BAG RETRIEVAL 10MM (BASKET) ×1 IMPLANT
TOWEL OR 17X26 10 PK STRL BLUE (TOWEL DISPOSABLE) ×1 IMPLANT
TRAY FOLEY MTR SLVR 16FR STAT (SET/KITS/TRAYS/PACK) ×1 IMPLANT
TRAY LAPAROSCOPIC (CUSTOM PROCEDURE TRAY) ×1 IMPLANT
TROCAR Z THREAD OPTICAL 12X100 (TROCAR) ×1 IMPLANT
TROCAR Z-THREAD OPTICAL 5X100M (TROCAR) IMPLANT
WATER STERILE IRR 1000ML POUR (IV SOLUTION) ×1 IMPLANT

## 2023-08-08 NOTE — Anesthesia Procedure Notes (Signed)
Procedure Name: Intubation Date/Time: 08/08/2023 12:42 PM  Performed by: Johnette Abraham, CRNAPre-anesthesia Checklist: Patient identified, Emergency Drugs available, Suction available and Patient being monitored Patient Re-evaluated:Patient Re-evaluated prior to induction Oxygen Delivery Method: Circle System Utilized Preoxygenation: Pre-oxygenation with 100% oxygen Induction Type: IV induction Ventilation: Mask ventilation without difficulty Laryngoscope Size: Mac and 3 Grade View: Grade II Tube type: Oral Tube size: 7.0 mm Number of attempts: 1 Airway Equipment and Method: Stylet and Oral airway Placement Confirmation: ETT inserted through vocal cords under direct vision, positive ETCO2 and breath sounds checked- equal and bilateral Secured at: 22 cm Tube secured with: Tape Dental Injury: Teeth and Oropharynx as per pre-operative assessment

## 2023-08-08 NOTE — Transfer of Care (Signed)
Immediate Anesthesia Transfer of Care Note  Patient: Erin Massey  Procedure(s) Performed: XI ROBOTIC ASSITED PARTIAL NEPHRECTOMY (Right)  Patient Location: PACU  Anesthesia Type:General  Level of Consciousness: awake, alert , oriented, and patient cooperative  Airway & Oxygen Therapy: Patient Spontanous Breathing and Patient connected to face mask oxygen  Post-op Assessment: Report given to RN, Post -op Vital signs reviewed and stable, and Patient moving all extremities  Post vital signs: Reviewed and stable  Last Vitals:  Vitals Value Taken Time  BP 154/71 08/08/23 1600  Temp    Pulse 60 08/08/23 1602  Resp 13 08/08/23 1602  SpO2 98 % 08/08/23 1602  Vitals shown include unfiled device data.  Last Pain:  Vitals:   08/08/23 1100  TempSrc:   PainSc: 0-No pain      Patients Stated Pain Goal: 4 (08/08/23 1100)  Complications: No notable events documented.

## 2023-08-08 NOTE — Anesthesia Postprocedure Evaluation (Signed)
Anesthesia Post Note  Patient: Erin Massey  Procedure(s) Performed: XI ROBOTIC ASSITED PARTIAL NEPHRECTOMY (Right)     Patient location during evaluation: PACU Anesthesia Type: General Level of consciousness: awake and alert Pain management: pain level controlled Vital Signs Assessment: post-procedure vital signs reviewed and stable Respiratory status: spontaneous breathing, nonlabored ventilation, respiratory function stable and patient connected to nasal cannula oxygen Cardiovascular status: blood pressure returned to baseline and stable Postop Assessment: no apparent nausea or vomiting Anesthetic complications: no  No notable events documented.  Last Vitals:  Vitals:   08/08/23 1630 08/08/23 1645  BP: (!) 156/71 (!) 155/78  Pulse: (!) 52 (!) 51  Resp: 11 11  Temp:  36.5 C  SpO2: 100% 100%    Last Pain:  Vitals:   08/08/23 1630  TempSrc:   PainSc: 0-No pain                 Tyeesha Riker L Imraan Wendell

## 2023-08-08 NOTE — Op Note (Signed)
Operative Note  Preoperative diagnosis:  1.  1.6 cm right renal mass  Postoperative diagnosis: 1.  1.6 cm right anterior renal mass 2.  1.5 cm right posterior renal mass  Procedure(s): 1.  Robot-assisted laparoscopic right partial nephrectomy 2.  Intraoperative ultrasound of single retroperitoneal organ  Surgeon: Rhoderick Moody, MD  Assistants:  None  Anesthesia:  General  Complications:  None  EBL: 100 mL  Specimens: 1.  Anterior and posterior right renal masses  Drains/Catheters: 1.  Foley catheter  Intraoperative findings:   Intraoperative ultrasound revealed a 1.5 cm right posterior renal lesion with heterogenous echogenicity, suspicious for neoplasm.  Upon further inspection of the anterior aspects of the kidney, there was another renal lesion with similar dimensions and ultrasonic characteristics.  Due to the concerning characteristics of both masses, they were both excised Grossly negative margins of both anterior and posterior right renal masses Both renorrhaphy's were hemostatic at the conclusion of the case Indication:  LYNETTE PETTIT is a 67 y.o. female with a solid and enhancing 1.6 cm right renal mass with features concerning for renal cell carcinoma.  She has been consented for the above procedures, voices understanding and wishes to proceed.  Description of procedure:  After informed consent was signed, the patient was taken back to the operating room and properly anesthetized.  The patient was then placed in the left lateral decubitus position with all pressure points padded.  The abdomen was then prepped and draped in the usual sterile fashion.  A time-out was then performed.    An 8 mm incision was then made lateral to the right rectus muscle at the level of the right 12th rib.  A Veress needle was then used to access the abdominal cavity.  A saline drop test showed no signs of obstruction and aspiration of the Veress needle revealed no blood or sucus.   The abdominal cavity was then insufflated to 15 mmHg.  An 8 mm robotic trocar was then atraumatically inserted into the abdominal cavity.  The robotic camera was then inserted through the port and inspection of the abdominal cavity revealed no evidence of adjacent organ or vessel injury.  We then placed three additional 8 mm robotic ports and a 12 mm assistant port in such as fashion as to triangulate the right renal hilum.  The robot was then docked into postion.   Using a combination of blunt and cold scissors dissection, the hepatic attachments were released from the abdominal sidewall.  The white line of Toldt along the ascending colon was then incised, allowing [place] to reflect the colon medially and expose the anterior surface of the right kidney.  The duodenum was then Kocherized medially, which abruptly led [place] to identification of the inferior vena cava.    Once the colon was adequately mobilized, we moved to the lower pole and identified the gonadal vein and ureter.  The gonadal vein was then left running parallel to the vena cava and the right ureter was reflected anteriorly.  Using cautious cautery, the overlying perihilar attachments were then released.  This yielded visualization of the renal hilum, which included a single right renal vein and 3 branches of the right renal artery.  The perilymphatic tissue surrounding the right renal artery were carefully released so that the right renal artery was fully encircled.    We next turned our attention to defatting the kidney.  An anterior incision along Gerota's fascia was created and the kidney was fully mobilized.   The renal  mass is identified at the posterior lateral aspects of the right mid pole.  Using intraoperative ultrasound, the tumor was then carefully evaluated and demonstrated heterogenous echogenicity compared to the rest of the renal parenchyma. The resection margin was marked to allow for wide excision of the renal mass.  The renal hilum  was once again identified and bulldog clamps were placed.  Using cold scissors, the right renal mass was then carefully excised, leaving a grossly negative margin.  Excision of the mass appeared complete with no tumor grossly remaining.  The tumor was then placed in the right lower quadrant, to be retrieved following repair of the renal defect.  A running 3-0 V lock suture was then used to reapproximate the resection bed.  Tension was placed with hemo-lock clips.   The right renal capsule was then reapproximated using a 0 Vicryl suture on a CT-1 needle in an interrupted fashion using hemo-lock clips as a buttress.  Lapra-Ty's were then placed on each of the interrupted sutures.  The bulldog was then released, which warm ischemia time totaled 22 minutes minutes.   Once hemostasis was achieved and the renal bed was irrigated, Surgicel and Vistaseal was then placed over the renorrhaphy.  Gerota's fascia was then reapproximated with a running v-lok suture and the mesocolonic fat along the descending colon was then reapproximated to the right abdominal sidewall using Hem-o-lok clips.  The renal mass was retrieved and placed in an Endo Catch bag.    Upon further inspection of the anterior aspects of the right kidney, no other suspicious lesion was identified.  Intraoperative ultrasound confirm heterogenous echogenicity of the lesion, which measures approximately 1.5 cm in greatest dimension.  Due to the suspicious nature of the anterior right renal mass, made decision to proceed with excising it.  Using intraoperative ultrasound, the resection margin was marked to allow for wide excision of the renal mass.  The renal hilum was once again identified and bulldog clamps were placed.  The right anterior renal mass was then sharply excised with grossly negative margins.  The renorrhaphy was performed using a running 3 OV lock suture in the parenchymal layer.  #1 Vicryl's were used to reapproximate the renal capsule, in an  interrupted fashion.  Lapra-Ty's were then placed on each of the interrupted sutures.  The bulldog was then released, which warm ischemia time totaled 11 minutes minutes.   Once hemostasis was achieved and the renal bed was irrigated, Surgicel and Vistaseal was then placed over the renorrhaphy.  The both masses were extracted through the 12 mm assistant port.  The fascia of the assistant port was then reapproximated with a 0 Vicryl suture.    The abdomen was desufflated with all ports removed.  The skin was then reapproximated using running Monocryl and dressed appropriately.  The patient was then replaced in the supine position and was awakened from anesthesia without complications.  Plan: Monitor on the floor overnight

## 2023-08-08 NOTE — Anesthesia Preprocedure Evaluation (Addendum)
Anesthesia Evaluation  Patient identified by MRN, date of birth, ID band Patient awake    Reviewed: Allergy & Precautions, NPO status , Patient's Chart, lab work & pertinent test results  Airway Mallampati: I  TM Distance: >3 FB Neck ROM: Full    Dental  (+) Chipped, Dental Advisory Given,    Pulmonary neg pulmonary ROS, Patient abstained from smoking.   Pulmonary exam normal breath sounds clear to auscultation       Cardiovascular negative cardio ROS Normal cardiovascular exam Rhythm:Regular Rate:Normal     Neuro/Psych negative neurological ROS  negative psych ROS   GI/Hepatic negative GI ROS,,,(+)     substance abuse  marijuana use, Hepatitis -  Endo/Other  negative endocrine ROS    Renal/GU negative Renal ROS  negative genitourinary   Musculoskeletal negative musculoskeletal ROS (+)    Abdominal   Peds  Hematology negative hematology ROS (+)   Anesthesia Other Findings   Reproductive/Obstetrics                             Anesthesia Physical Anesthesia Plan  ASA: 2  Anesthesia Plan: General   Post-op Pain Management: Tylenol PO (pre-op)* and Ketamine IV*   Induction: Intravenous  PONV Risk Score and Plan: 3 and Midazolam, Dexamethasone and Ondansetron  Airway Management Planned: Oral ETT  Additional Equipment:   Intra-op Plan:   Post-operative Plan: Extubation in OR  Informed Consent: I have reviewed the patients History and Physical, chart, labs and discussed the procedure including the risks, benefits and alternatives for the proposed anesthesia with the patient or authorized representative who has indicated his/her understanding and acceptance.     Dental advisory given  Plan Discussed with: CRNA  Anesthesia Plan Comments: (2 IVs)       Anesthesia Quick Evaluation

## 2023-08-08 NOTE — Discharge Instructions (Signed)
Activity:  You are encouraged to ambulate frequently (about every hour during waking hours) to help prevent blood clots from forming in your legs or lungs.  However, you should not engage in any heavy lifting (> 10-15 lbs), strenuous activity, or straining. Diet: You should advance your diet as instructed by your physician.  It will be normal to have some bloating, nausea, and abdominal discomfort intermittently. Prescriptions:  You will be provided a prescription for pain medication to take as needed.  If your pain is not severe enough to require the prescription pain medication, you may take extra strength Tylenol instead which will have less side effects.  You should also take a prescribed stool softener to avoid straining with bowel movements as the prescription pain medication may constipate you. Incisions: You may remove your dressing bandages 48 hours after surgery if not removed in the hospital.  You will either have some small staples or special tissue glue at each of the incision sites. Once the bandages are removed (if present), the incisions may stay open to air.  You may start showering (but not soaking or bathing in water) the 2nd day after surgery and the incisions simply need to be patted dry after the shower.  No additional care is needed. What to call us about: You should call the office ([phone]) if you develop fever > 101 or develop persistent vomiting.   You may resume aspirin, advil, aleve, vitamins, and supplements 7 days after surgery. 

## 2023-08-09 ENCOUNTER — Encounter (HOSPITAL_COMMUNITY): Payer: Self-pay | Admitting: Urology

## 2023-08-09 DIAGNOSIS — N35919 Unspecified urethral stricture, male, unspecified site: Secondary | ICD-10-CM | POA: Diagnosis present

## 2023-08-09 LAB — HEMOGLOBIN AND HEMATOCRIT, BLOOD
HCT: 37.3 % (ref 36.0–46.0)
Hemoglobin: 11.6 g/dL — ABNORMAL LOW (ref 12.0–15.0)

## 2023-08-09 LAB — BASIC METABOLIC PANEL
Anion gap: 11 (ref 5–15)
BUN: 16 mg/dL (ref 8–23)
CO2: 21 mmol/L — ABNORMAL LOW (ref 22–32)
Calcium: 8.4 mg/dL — ABNORMAL LOW (ref 8.9–10.3)
Chloride: 105 mmol/L (ref 98–111)
Creatinine, Ser: 1 mg/dL (ref 0.44–1.00)
GFR, Estimated: >60 mL/min (ref >60)
Glucose, Bld: 105 mg/dL — ABNORMAL HIGH (ref 70–99)
Potassium: 4.2 mmol/L (ref 3.5–5.1)
Sodium: 137 mmol/L (ref 135–145)

## 2023-08-09 NOTE — Progress Notes (Signed)
1 Day Post-Op  Subjective: Erin Massey is doing well pod#1 from a right RALPN.  She still has the foley and hasn't had a diet yet.  She has some postop abdominal and right shoulder pain.  She has no nausea or other complaints.  ROS:  Review of Systems  Cardiovascular:  Positive for chest pain (shoulder pain from the pneumoperitoneum).  Gastrointestinal:  Positive for abdominal pain (mild).  All other systems reviewed and are negative.   Anti-infectives: Anti-infectives (From admission, onward)    Start     Dose/Rate Route Frequency Ordered Stop   08/08/23 1030  ceFAZolin (ANCEF) IVPB 2g/100 mL premix        2 g 200 mL/hr over 30 Minutes Intravenous 30 min pre-op 08/08/23 1030 08/08/23 1245       Current Facility-Administered Medications  Medication Dose Route Frequency Provider Last Rate Last Admin   0.45 % sodium chloride infusion   Intravenous Continuous Harrie Foreman, PA-C 100 mL/hr at 08/09/23 0649 Infusion Verify at 08/09/23 0649   acetaminophen (TYLENOL) tablet 1,000 mg  1,000 mg Oral Q6H Dancy, Amanda, PA-C   1,000 mg at 08/09/23 9147   diphenhydrAMINE (BENADRYL) injection 12.5 mg  12.5 mg Intravenous Q6H PRN Harrie Foreman, PA-C       Or   diphenhydrAMINE (BENADRYL) 12.5 MG/5ML elixir 12.5 mg  12.5 mg Oral Q6H PRN Dancy, Amanda, PA-C       docusate sodium (COLACE) capsule 100 mg  100 mg Oral BID Dancy, Amanda, PA-C   100 mg at 08/08/23 2305   HYDROmorphone (DILAUDID) injection 0.5-1 mg  0.5-1 mg Intravenous Q2H PRN Harrie Foreman, PA-C   0.5 mg at 08/08/23 1927   hyoscyamine (LEVSIN SL) SL tablet 0.125 mg  0.125 mg Sublingual Q4H PRN Harrie Foreman, PA-C       ondansetron (ZOFRAN) injection 4 mg  4 mg Intravenous Q4H PRN Harrie Foreman, PA-C   4 mg at 08/08/23 1824   oxyCODONE (Oxy IR/ROXICODONE) immediate release tablet 5 mg  5 mg Oral Q4H PRN Harrie Foreman, PA-C         Objective: Vital signs in last 24 hours: Temp:  [97.5 F (36.4 C)-99 F (37.2 C)] 98.4 F (36.9 C) (08/17  0936) Pulse Rate:  [51-79] 61 (08/17 0936) Resp:  [11-16] 16 (08/17 0507) BP: (126-160)/(70-79) 141/70 (08/17 0936) SpO2:  [98 %-100 %] 99 % (08/17 0936)  Intake/Output from previous day: 08/16 0701 - 08/17 0700 In: 3378.4 [P.O.:240; I.V.:3038.4; IV Piggyback:100] Out: 1650 [Urine:1550; Blood:100] Intake/Output this shift: No intake/output data recorded.   Physical Exam Vitals reviewed.  Constitutional:      Appearance: Normal appearance.  Cardiovascular:     Rate and Rhythm: Normal rate and regular rhythm.     Heart sounds: Normal heart sounds.  Pulmonary:     Effort: Pulmonary effort is normal. No respiratory distress.     Breath sounds: Normal breath sounds.  Abdominal:     General: Abdomen is flat. Bowel sounds are normal.     Palpations: Abdomen is soft.     Tenderness: There is no abdominal tenderness.  Musculoskeletal:        General: No swelling or tenderness. Normal range of motion.  Skin:    General: Skin is warm and dry.  Neurological:     General: No focal deficit present.     Mental Status: She is alert and oriented to person, place, and time.     Lab Results:  Recent Labs    08/08/23 1629  08/09/23 0527  HGB 13.0 11.6*  HCT 41.8 37.3   BMET Recent Labs    08/09/23 0527  NA 137  K 4.2  CL 105  CO2 21*  GLUCOSE 105*  BUN 16  CREATININE 1.00  CALCIUM 8.4*   PT/INR No results for input(s): "LABPROT", "INR" in the last 72 hours. ABG No results for input(s): "PHART", "HCO3" in the last 72 hours.  Invalid input(s): "PCO2", "PO2"  Studies/Results: No results found.   Assessment and Plan: Right renal mass s/p right RALPN.  She is doing well but still has a foley and hasn't had a diet yet.   I will order the foley out and start her on clears.  She will be ok for D/C later today if she is able to void and tolerate the clears.       LOS: 1 day    [name] 8/17/2024Patient ID: Erin Massey, female   DOB: 1956/03/07, 67 y.o.   MRN:  [id number]

## 2023-08-09 NOTE — TOC Transition Note (Signed)
Transition of Care [facility]) - CM/SW Discharge Note   Patient Details  Name: Erin Massey MRN: [id number] Date of Birth: March 10, 1956  Transition of Care [facility]) CM/SW Contact:  Lanier Clam, RN Phone Number: 08/09/2023, 3:32 PM   Clinical Narrative:  d/c home no needs or orders.     Final next level of care: Home/Self Care Barriers to Discharge: No Barriers Identified   Patient Goals and CMS Choice      Discharge Placement                         Discharge Plan and Services Additional resources added to the After Visit Summary for                                       Social Determinants of Health (SDOH) Interventions SDOH Screenings   Food Insecurity: No Food Insecurity (08/08/2023)  Housing: Low Risk  (08/08/2023)  Transportation Needs: No Transportation Needs (08/08/2023)  Utilities: Not At Risk (08/08/2023)  Financial Resource Strain: High Risk (04/01/2023)   Received from [facility], [facility]  Physical Activity: Unknown (04/01/2023)   Received from [facility], [facility]  Social Connections: Socially Integrated (11/09/2022)   Received from [facility], [facility]  Stress: Stress Concern Present (04/01/2023)   Received from [facility], [facility]  Tobacco Use: Low Risk  (08/08/2023)     Readmission Risk Interventions     No data to display

## 2023-08-09 NOTE — Discharge Summary (Signed)
Physician Discharge Summary  Patient ID: Erin Massey MRN: [id number] DOB/AGE: February 19, 1956 67 y.o.  Admit date: 08/08/2023 Discharge date: 08/09/2023  Admission Diagnoses:  Renal mass  Discharge Diagnoses:  Principal Problem:   Renal mass Active Problems:   Urethral meatal stenosis   Past Medical History:  Diagnosis Date   Cancer [facility])    skin cancer   Hepatitis    age 72    Surgeries: Procedure(s): XI ROBOTIC ASSITED PARTIAL NEPHRECTOMY on 08/08/2023   Consultants (if any): Treatment Team:  Rene Paci, MD  Discharged Condition: Improved  Hospital Course: Erin Massey is an 67 y.o. female who was admitted 08/08/2023 with a diagnosis of Renal mass and went to the operating room on 08/08/2023 and underwent the above named procedures.  She tolerated liquids and voided after foley removal and was felt to be ok for discharge.   She was given perioperative antibiotics:  Anti-infectives (From admission, onward)    Start     Dose/Rate Route Frequency Ordered Stop   08/08/23 1030  ceFAZolin (ANCEF) IVPB 2g/100 mL premix        2 g 200 mL/hr over 30 Minutes Intravenous 30 min pre-op 08/08/23 1030 08/08/23 1245     .  She was given sequential compression devices,for DVT prophylaxis.  She benefited maximally from the hospital stay and there were no complications.    Recent vital signs:  Vitals:   08/09/23 0936 08/09/23 1240  BP: (!) 141/70 139/60  Pulse: 61 69  Resp:    Temp: 98.4 F (36.9 C) 98.2 F (36.8 C)  SpO2: 99% 97%   Gen: WD, WN in NAD. Lungs: CTA CV: RRR  GI: soft, minimal tenderness with positive BS.   Recent laboratory studies:  Lab Results  Component Value Date   HGB 11.6 (L) 08/09/2023   HGB 13.0 08/08/2023   HGB 14.2 08/01/2023   Lab Results  Component Value Date   WBC 7.5 08/01/2023   PLT 217 08/01/2023   No results found for: "INR" Lab Results  Component Value Date   NA 137 08/09/2023   K 4.2 08/09/2023   CL 105  08/09/2023   CO2 21 (L) 08/09/2023   BUN 16 08/09/2023   CREATININE 1.00 08/09/2023   GLUCOSE 105 (H) 08/09/2023    Discharge Medications:   Allergies as of 08/09/2023   No Known Allergies      Medication List     STOP taking these medications    BLACK CURRANT SEED OIL PO   Centrum Silver 50+Women Tabs   [employer] TRIPLE MAGNESIUM COMPLEX PO   GLUTATHIONE PO   OVER THE COUNTER MEDICATION   OVER THE COUNTER MEDICATION   VITAMIN D-VITAMIN K PO   zinc gluconate 50 MG tablet       TAKE these medications    docusate sodium 100 MG capsule Commonly known as: COLACE Take 1 capsule (100 mg total) by mouth 2 (two) times daily.   HYDROcodone-acetaminophen 5-325 MG tablet Commonly known as: Norco Take 1-2 tablets by mouth every 6 (six) hours as needed for moderate pain or severe pain.        Diagnostic Studies: No results found.  Disposition: Discharge disposition: 01-Home or Self Care       Discharge Instructions     Discontinue IV   Complete by: As directed         Follow-up Information     Massey, Erin Aaron, MD Follow up on 08/22/2023.   Specialty: Urology Why:  at 11:00 Contact information: [address] [place] [address] [phone]                  Signed: [name] 08/09/2023, 2:43 PM

## 2023-08-12 LAB — SURGICAL PATHOLOGY

## 2023-11-25 ENCOUNTER — Ambulatory Visit (HOSPITAL_COMMUNITY)
Admission: RE | Admit: 2023-11-25 | Discharge: 2023-11-25 | Disposition: A | Discharge status: Home / Self Care | Payer: Managed Care, Other (non HMO) | Source: Ambulatory Visit | Attending: Urology | Admitting: Urology

## 2023-11-25 ENCOUNTER — Other Ambulatory Visit (HOSPITAL_COMMUNITY): Payer: Self-pay | Admitting: Urology

## 2023-11-25 DIAGNOSIS — Z85528 Personal history of other malignant neoplasm of kidney: Secondary | ICD-10-CM | POA: Diagnosis present

## 2024-05-27 ENCOUNTER — Ambulatory Visit (HOSPITAL_COMMUNITY)
Admission: RE | Admit: 2024-05-27 | Discharge: 2024-05-27 | Disposition: A | Discharge status: Home / Self Care | Source: Ambulatory Visit | Attending: Adult Health | Admitting: Adult Health

## 2024-05-27 ENCOUNTER — Other Ambulatory Visit (HOSPITAL_COMMUNITY): Payer: Self-pay | Admitting: Adult Health

## 2024-05-27 DIAGNOSIS — C641 Malignant neoplasm of right kidney, except renal pelvis: Secondary | ICD-10-CM
